# Patient Record
Sex: Male | Born: 1995 | Race: White | Hispanic: No | Marital: Single | Smoking: Current every day smoker
Health system: Southern US, Community
[De-identification: ages and names within clinical notes are randomized; demographics above are authoritative.]

## PROBLEM LIST (undated history)

## (undated) DIAGNOSIS — Z87442 Personal history of urinary calculi: Secondary | ICD-10-CM

## (undated) DIAGNOSIS — Z973 Presence of spectacles and contact lenses: Secondary | ICD-10-CM

## (undated) DIAGNOSIS — J45909 Unspecified asthma, uncomplicated: Secondary | ICD-10-CM

## (undated) HISTORY — PX: WISDOM TOOTH EXTRACTION: SHX21

## (undated) HISTORY — PX: MASTECTOMY: SHX3

---
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
## 2015-03-05 MED FILL — CLINDAMYCIN HCL 300 MG CAPS: 300 | 7 days supply | Qty: 21 | Fill #0

## 2015-03-05 MED FILL — HYDROCODON-APAP 10-325: 10-325 | 5 days supply | Qty: 30 | Fill #0

## 2015-03-05 MED FILL — DEXAMETHASONE 4 MG TABLET: 4 | 3 days supply | Qty: 9 | Fill #0

## 2015-04-08 MED FILL — MINOCYCLINE 50 MG CAPSULE: 50 | 30 days supply | Qty: 60 | Fill #2

## 2015-04-18 MED FILL — TESTOSTERONE CYP 200 MG/ML: 200 | 87 days supply | Qty: 5 | Fill #1

## 2015-07-17 MED FILL — ONDANSETRON ODT 8 MG TABLET: 8 | 5 days supply | Qty: 15 | Fill #0

## 2015-07-17 MED FILL — SULFAMETHOXAZOLE-TMP DS TAB: 800-160 | 7 days supply | Qty: 14 | Fill #0

## 2015-07-17 MED FILL — oxyCODONE HCL 5 MG TABS: 5 | 5 days supply | Qty: 40 | Fill #0

## 2015-07-17 MED FILL — NAPROXEN 500 MG TABLET: 500 | 30 days supply | Qty: 60 | Fill #0

## 2015-07-17 MED FILL — CYCLOBENZAPRINE 10 MG TAB: 10 | 14 days supply | Qty: 42 | Fill #0

## 2015-07-17 MED FILL — TRANSDERM-SCOP 1.5 MG/3 DAY: 1 | 1 days supply | Qty: 1 | Fill #0

## 2015-08-06 DIAGNOSIS — K719 Toxic liver disease, unspecified: Secondary | ICD-10-CM | POA: Diagnosis not present

## 2015-08-06 DIAGNOSIS — E281 Androgen excess: Secondary | ICD-10-CM | POA: Diagnosis not present

## 2015-08-06 DIAGNOSIS — D751 Secondary polycythemia: Secondary | ICD-10-CM | POA: Diagnosis not present

## 2015-08-06 MED FILL — TESTOSTERONE CYP 200 MG/ML: 200 | 35 days supply | Qty: 2 | Fill #0

## 2015-10-16 MED FILL — TESTOSTERONE CYP 200 MG/ML: 200 | 90 days supply | Qty: 5 | Fill #0

## 2016-01-13 MED FILL — TESTOSTERONE CYP 200 MG/ML: 200 | 90 days supply | Qty: 5 | Fill #1

## 2016-02-27 DIAGNOSIS — J029 Acute pharyngitis, unspecified: Secondary | ICD-10-CM | POA: Diagnosis not present

## 2016-02-27 MED FILL — AZITHROMYCIN 250 MG TABLET: 250 | 5 days supply | Qty: 6 | Fill #0

## 2016-04-07 DIAGNOSIS — F641 Gender identity disorder in adolescence and adulthood: Secondary | ICD-10-CM | POA: Diagnosis not present

## 2016-07-22 DIAGNOSIS — H5213 Myopia, bilateral: Secondary | ICD-10-CM | POA: Diagnosis not present

## 2016-07-22 DIAGNOSIS — H52223 Regular astigmatism, bilateral: Secondary | ICD-10-CM | POA: Diagnosis not present

## 2016-09-23 DIAGNOSIS — K719 Toxic liver disease, unspecified: Secondary | ICD-10-CM | POA: Diagnosis not present

## 2016-09-23 DIAGNOSIS — D751 Secondary polycythemia: Secondary | ICD-10-CM | POA: Diagnosis not present

## 2016-09-23 DIAGNOSIS — E291 Testicular hypofunction: Secondary | ICD-10-CM | POA: Diagnosis not present

## 2017-02-18 DIAGNOSIS — E281 Androgen excess: Secondary | ICD-10-CM | POA: Diagnosis not present

## 2017-02-18 DIAGNOSIS — D751 Secondary polycythemia: Secondary | ICD-10-CM | POA: Diagnosis not present

## 2017-07-21 ENCOUNTER — Encounter: Payer: Self-pay | Admitting: Family Medicine

## 2017-07-21 ENCOUNTER — Other Ambulatory Visit: Payer: Self-pay

## 2017-07-21 ENCOUNTER — Ambulatory Visit (INDEPENDENT_AMBULATORY_CARE_PROVIDER_SITE_OTHER): Payer: No Typology Code available for payment source | Admitting: Family Medicine

## 2017-07-21 VITALS — BP 100/68 | HR 106 | Temp 99.3°F | Ht 70.0 in | Wt 220.2 lb

## 2017-07-21 DIAGNOSIS — F640 Transsexualism: Secondary | ICD-10-CM | POA: Diagnosis not present

## 2017-07-21 DIAGNOSIS — Z789 Other specified health status: Secondary | ICD-10-CM

## 2017-07-21 MED ORDER — "NEEDLE (DISP) 25G X 1"" MISC": 3 refills | Status: AC

## 2017-07-21 MED ORDER — TESTOSTERONE CYPIONATE 200 MG/ML IM SOLN: INTRAMUSCULAR | 3 refills | Status: DC

## 2017-07-21 MED ORDER — SYRINGE (DISPOSABLE) 1 ML MISC: 3 refills | Status: AC

## 2017-07-21 MED ORDER — "NEEDLE (DISP) 18G X 1-1/2"" MISC": 3 refills | Status: AC

## 2017-07-21 MED FILL — TESTOSTERONE CYP 200 MG/ML: 200 | 87 days supply | Qty: 5 | Fill #0

## 2017-07-21 NOTE — Patient Instructions (Signed)
Come back for labs in a couple of weeks. I have called in refills. Let me know of ANY issues. Great to meet you!

## 2017-07-22 DIAGNOSIS — Z789 Other specified health status: Secondary | ICD-10-CM | POA: Insufficient documentation

## 2017-07-22 DIAGNOSIS — F640 Transsexualism: Secondary | ICD-10-CM | POA: Insufficient documentation

## 2017-07-22 NOTE — Progress Notes (Signed)
    CHIEF COMPLAINT / HPI:  new patoient for establish care. Traistioned Fto M > 2 years ah\go. Stable on testosterone.His provider retired. Has had mastectomy.  Has had some mild depressive symptoms intermittently throughout last few years. Saw a  Again. WOnders if I\I have any resources (specific to LGBTQ population)therapist for a while during the transition. Not having any significant issues right now but occasionally feells stressed and sad. 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  PERTINENT  PMH / PSH: I have reviewed the patient's medications, allergies, past medical and surgical history, smoking status and updated in the EMR as appropriate.   OBJECTIVE:  Vital signs reviewed. GENERAL: Well-developed, well-nourished, no acute distress. CARDIOVASCULAR: Regular rate and rhythm no murmur gallop or rub LUNGS: Clear to auscultation bilaterally, no rales or wheeze. ABDOMEN: Soft positive bowel sounds NEURO: No gross focal neurological deficits. MSK: Movement of extremity x 4.    ASSESSMENT / PLAN: Refills on testosterone. He is doing IM and we discussed possibly of SubQ but he thinks he is going to stay with current method Labs Information on EACP and other therao\psit options.

## 2017-08-03 ENCOUNTER — Other Ambulatory Visit: Payer: No Typology Code available for payment source

## 2017-09-28 ENCOUNTER — Telehealth: Payer: Self-pay | Admitting: Family Medicine

## 2017-09-28 DIAGNOSIS — Z789 Other specified health status: Secondary | ICD-10-CM

## 2017-09-28 DIAGNOSIS — F640 Transsexualism: Secondary | ICD-10-CM

## 2017-09-28 NOTE — Telephone Encounter (Signed)
Dear Ernest Haynes Team Orders are placed. He can call for lab appointment Please let him know THANKS! Denny LevySara Yailen Zemaitis

## 2017-09-28 NOTE — Telephone Encounter (Signed)
Contacted pt and scheduled lab appointment for 09/29/2017. Lamonte SakaiZimmerman Rumple, April D, [place]

## 2017-09-28 NOTE — Telephone Encounter (Signed)
Dr Jennette Kettleneal had requested labs in June 2019. Pt was not able to keep the appt. He would like to to the labs now. Please request the labs and contact him to schedule them

## 2017-09-29 ENCOUNTER — Other Ambulatory Visit: Payer: No Typology Code available for payment source

## 2017-09-29 DIAGNOSIS — F640 Transsexualism: Secondary | ICD-10-CM

## 2017-09-29 DIAGNOSIS — Z789 Other specified health status: Secondary | ICD-10-CM

## 2017-09-29 MED FILL — DICLOFENAC SOD EC 75 MG TAB: 75 | 15 days supply | Qty: 30 | Fill #0

## 2017-09-29 MED FILL — MELOXICAM 15 MG TABLET: 15 | 30 days supply | Qty: 30 | Fill #0

## 2017-09-30 ENCOUNTER — Encounter: Payer: Self-pay | Admitting: Physical Therapy

## 2017-09-30 ENCOUNTER — Ambulatory Visit: Payer: No Typology Code available for payment source | Attending: Family Medicine | Admitting: Physical Therapy

## 2017-09-30 ENCOUNTER — Other Ambulatory Visit: Payer: Self-pay

## 2017-09-30 DIAGNOSIS — G8929 Other chronic pain: Secondary | ICD-10-CM | POA: Diagnosis present

## 2017-09-30 DIAGNOSIS — R2689 Other abnormalities of gait and mobility: Secondary | ICD-10-CM | POA: Insufficient documentation

## 2017-09-30 DIAGNOSIS — M545 Low back pain, unspecified: Secondary | ICD-10-CM

## 2017-09-30 DIAGNOSIS — M62830 Muscle spasm of back: Secondary | ICD-10-CM | POA: Insufficient documentation

## 2017-09-30 LAB — LIPID PANEL
CHOL/HDL RATIO: 6.1 ratio — AB (ref 0.0–4.4)
Cholesterol, Total: 195 mg/dL (ref 100–199)
HDL: 32 mg/dL — ABNORMAL LOW (ref >39)
LDL Calculated: 129 mg/dL — ABNORMAL HIGH (ref 0–99)
Triglycerides: 168 mg/dL — ABNORMAL HIGH (ref 0–149)
VLDL Cholesterol Cal: 34 mg/dL (ref 5–40)

## 2017-09-30 LAB — CBC
HEMATOCRIT: 52.7 % — AB (ref 34.0–46.6)
HEMOGLOBIN: 18.2 g/dL — AB (ref 11.1–15.9)
MCH: 31.7 pg (ref 26.6–33.0)
MCHC: 34.5 g/dL (ref 31.5–35.7)
MCV: 92 fL (ref 79–97)
PLATELETS: 323 10*3/uL (ref 150–450)
RBC: 5.74 x10E6/uL — ABNORMAL HIGH (ref 3.77–5.28)
RDW: 11.8 % — AB (ref 12.3–15.4)
WBC: 8.4 10*3/uL (ref 3.4–10.8)

## 2017-09-30 LAB — BASIC METABOLIC PANEL
BUN/Creatinine Ratio: 14 (ref 9–23)
BUN: 14 mg/dL (ref 6–20)
CHLORIDE: 101 mmol/L (ref 96–106)
CO2: 23 mmol/L (ref 20–29)
Calcium: 9.7 mg/dL (ref 8.7–10.2)
Creatinine, Ser: 0.98 mg/dL (ref 0.57–1.00)
GFR calc Af Amer: 95 mL/min/{1.73_m2} (ref >59)
GFR, EST NON AFRICAN AMERICAN: 83 mL/min/{1.73_m2} (ref >59)
GLUCOSE: 88 mg/dL (ref 65–99)
POTASSIUM: 4.4 mmol/L (ref 3.5–5.2)
SODIUM: 140 mmol/L (ref 134–144)

## 2017-09-30 LAB — TESTOSTERONE: Testosterone: 389 ng/dL — ABNORMAL HIGH (ref 8–48)

## 2017-10-01 NOTE — Therapy (Signed)
[facility] [address] [place], [place], [address] Phone: [phone]   Fax:  [phone]  Physical Therapy Evaluation  Patient Details  Name: Ernest Haynes MRN: [id number] Date of Birth: 1995-12-02 Referring Provider: Dr Deatra James    Encounter Date: 09/30/2017  PT End of Session - 09/30/17 1350    Visit Number  1    Number of Visits  12    Date for PT Re-Evaluation  11/11/17    Authorization Type  [facility] focus plan     PT Start Time  1335    PT Stop Time  1415    PT Time Calculation (min)  40 min    Activity Tolerance  Patient tolerated treatment well    Behavior During Therapy  [facility] for tasks assessed/performed       History reviewed. No pertinent past medical history.  History reviewed. No pertinent surgical history.  There were no vitals filed for this visit.   Subjective Assessment - 09/30/17 1340    Subjective  Patient began having low back pain about 2 weeks ago. He does not remmeber doing anything but he does work at [employer] and does a lot of lifting. His pain is in the middle of his back and radiates into his legs. It can be in either leg. The patient has had back pain in the past but it was not as intense and did not last as long.     Limitations  Walking;Lifting    How long can you sit comfortably?  depends on how it is feeling     How long can you stand comfortably?  depends on how it is feeling     How long can you walk comfortably?  short distances increase pain     Diagnostic tests  nothing taken     Patient Stated Goals  less pain; be able to stretch it out     Currently in Pain?  Yes    Pain Score  6  At worst     Pain Location  Back    Pain Orientation  Mid    Pain Descriptors / Indicators  Aching    Pain Type  Chronic pain    Pain Onset  More than a month ago    Pain Frequency  Constant    Aggravating Factors   lifting, walking, worse in the morning     Pain Relieving Factors  trying to  find the right position    Effect of Pain on Daily Activities  diffiulty perfroming work tasks          [facility] PT Assessment - 10/01/17 0001      Assessment   Medical Diagnosis  Low Back Pain     Referring Provider  Dr Deatra James     Onset Date/Surgical Date  -- 2 weeks prior     Hand Dominance  Right    Next MD Visit  nothjing Scheduled     Prior Therapy  None       Precautions   Precautions  None      Restrictions   Weight Bearing Restrictions  No      Balance Screen   Has the patient fallen in the past 6 months  No    Has the patient had a decrease in activity level because of a fear of falling?   No    Is the patient reluctant to leave their home because of a fear of falling?  No      Home Environment   Additional Comments  Stairs going into his house but dont hurt his back.       Prior Function   Level of Independence  Independent    Vocation  Full time employment    Vocation Requirements  Works at [employer] book [employer]      Cognition   Overall Cognitive Status  Within Capital OneFunctional Limits for tasks assessed    Attention  Focused    Focused Attention  Appears intact    Memory  Appears intact    Awareness  Appears intact    Problem Solving  Appears intact      Observation/Other Assessments   Focus on Therapeutic Outcomes (FOTO)   54% limitation       Sensation   Additional Comments  Pain radiating into both legs at times; it can change which side       Coordination   Gross Motor Movements are Fluid and Coordinated  Yes    Fine Motor Movements are Fluid and Coordinated  Yes      Posture/Postural Control   Posture/Postural Control  No significant limitations      AROM   Overall AROM Comments  Pain with bilateral side glide     Lumbar Flexion  34    Lumbar Extension  15    Lumbar - Right Side Bend  pain     Lumbar - Left Side Bend  pain equal to the left     Lumbar - Right Rotation  limited 25 % with pain     Lumbar - Left Rotation  limited 25% with equal pain        PROM   Overall PROM Comments  pain with end range hip flexion bilateral       Strength   Overall Strength Comments  5/5 gross bilateral lower extremity strength       Flexibility   Soft Tissue Assessment /Muscle Length  yes    Hamstrings  limited 40 degrees 90/90 bilateral       Palpation   SI assessment   pain with SI compression       Special Tests   Other special tests  (-) SLR bilateral       Ambulation/Gait   Gait Comments  walks with flexed trunk with decreased bilateral lower extremity flexion and decreased hip rotation,.                 Objective measurements completed on examination: See above findings.      [facility] Adult PT Treatment/Exercise - 10/01/17 0001      Lumbar Exercises: Stretches   Active Hamstring Stretch Limitations  reviewed 3 position hamstring stretch     Piriformis Stretch Limitations  2x20 sec hold       Lumbar Exercises: Standing   Other Standing Lumbar Exercises  reviewed proper lifting technque     Other Standing Lumbar Exercises  tennis ball trigger point release       Lumbar Exercises: Supine   Pelvic Tilt Limitations  x10 with abdominal breathing              PT Education - 09/30/17 1350    Education Details  HEP; symptom mangement     Person(s) Educated  Patient    Methods  Explanation;Demonstration;Tactile cues;Verbal cues    Comprehension  Verbalized understanding;Returned demonstration;Verbal cues required;Tactile cues required;Need further instruction       PT Short Term Goals - 10/01/17 [id number]  PT SHORT TERM GOAL #1   Title  Patient will be independent with basic HEP     Time  4    Period  Weeks    Target Date  10/29/17      PT SHORT TERM GOAL #2   Title  Patient will report no radiating pain in to his legs with pain no > 2/10     Time  4    Period  Weeks    Status  New    Target Date  10/29/17      PT SHORT TERM GOAL #3   Title  Patient will increase pain free lumbar flexion by 30 degrees     Time  4    Period  Weeks    Status  New    Target Date  10/29/17        PT Long Term Goals - 10/01/17 0821      PT LONG TERM GOAL #1   Title  Patient will perfrom wark tasks for 4 hours without increased pain     Time  8    Period  Weeks    Status  New    Target Date  11/26/17      PT LONG TERM GOAL #2   Title  Patient will demostrate a 24% limitation on FOTO     Time  9    Period  Weeks    Status  New    Target Date  11/26/17             Plan - 09/30/17 1351    Clinical Impression Statement  Patient is a 22 year old male who\ with lower back pain that at times radiates into both legs. He has pain with forward flexion but also pain in prone. Is is questionable if it is a disc buldge and with his age stenosis is questioanble as well. He may just have an acute strain of his back. he has aspasming of his bilateral parapsinals and into his glutes. He was given stretches and a tennis ball for soft tissue mobilization. He also has pain with palpation and decreased spring in his SI. He would benefit from skilled therapy to decrease acute spasming and decrease pain. Therapy also reviewed proper lifting technique with the patient.     History and Personal Factors relevant to plan of care:  none     Clinical Presentation  Evolving    Clinical Decision Making  Moderate    Rehab Potential  Good    PT Frequency  2x / week    PT Duration  6 weeks    PT Treatment/Interventions  Cryotherapy;Electrical Stimulation;ADLs/Self Care Home Management;Moist Heat;Ultrasound;DME Instruction;Stair training;Gait training;Functional mobility training;Therapeutic activities;Therapeutic exercise;Neuromuscular re-education;Patient/family education;Manual techniques;Passive range of motion;Dry needling;Taping    PT Next Visit Plan  consdier SI mobilizations; trigger point release to bilateral parapsinals and bilarteral glutes; begin core strengthening; consider thomas stretch and prayer stretch; add  clamshell with ab breathing; add  bridging if tolerated. Prone positioning hurts him.  review lifting technique at some point when his pain calms down. cosndier supine march with abdominal breathing     PT Home Exercise Plan  PPT with abdominal breathing; pirifromis stretch; hamstring stretch; lower trunk rotation for the morning     Consulted and Agree with Plan of Care  Patient       Patient will benefit from skilled therapeutic intervention in order to improve the following deficits and impairments:  Abnormal gait, Pain, Decreased activity tolerance,  Decreased endurance, Decreased range of motion, Decreased strength, Difficulty walking  Visit Diagnosis: Chronic bilateral low back pain without sciatica  Muscle spasm of back  Other abnormalities of gait and mobility     Problem List Patient Active Problem List   Diagnosis Date Noted  . 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 Gender dysphoria in adult 07/22/2017    Dessie Coma  PT DPT  10/01/2017, 8:27 AM  [facility] [address] [place], [place], [address] Phone: [phone]   Fax:  [phone]  Name: Ernest Haynes MRN: [id number] Date of Birth: December 02, 1995

## 2017-10-05 ENCOUNTER — Encounter: Payer: Self-pay | Admitting: Physical Therapy

## 2017-10-05 ENCOUNTER — Ambulatory Visit: Payer: No Typology Code available for payment source | Admitting: Physical Therapy

## 2017-10-05 DIAGNOSIS — M62830 Muscle spasm of back: Secondary | ICD-10-CM

## 2017-10-05 DIAGNOSIS — M545 Low back pain: Secondary | ICD-10-CM | POA: Diagnosis not present

## 2017-10-05 DIAGNOSIS — G8929 Other chronic pain: Secondary | ICD-10-CM

## 2017-10-05 DIAGNOSIS — R2689 Other abnormalities of gait and mobility: Secondary | ICD-10-CM

## 2017-10-05 NOTE — Patient Instructions (Signed)

## 2017-10-05 NOTE — Therapy (Addendum)
[place], [place], [address] Phone: [phone]   Fax:  [phone]  Physical Therapy Treatment/ Discharge   Patient Details  Name: Ernest Haynes MRN: [id number] Date of Birth: [birth year] Referring Provider: Dr Donald Prose    Encounter Date: 10/05/2017  PT End of Session - 10/05/17 1250    Visit Number  2    Number of Visits  12    Date for PT Re-Evaluation  11/11/17    PT Start Time  1150    PT Stop Time  1231    PT Time Calculation (min)  41 min    Activity Tolerance  Patient tolerated treatment well    Behavior During Therapy  [facility] for tasks assessed/performed       History reviewed. No pertinent past medical history.  History reviewed. No pertinent surgical history.  There were no vitals filed for this visit.  Subjective Assessment - 10/05/17 1158    Subjective  has been doing the exercises.  pain 80% improvement.    Pain Location  Hip    Pain Orientation  Right;Left superior    Pain Descriptors / Indicators  Aching    Pain Type  Chronic pain    Pain Frequency  Intermittent    Aggravating Factors   walking longer distances  ,, resting between walking.     Pain Relieving Factors  exercise,,  rest    Effect of Pain on Daily Activities  not lifting, off work for now.                        [facility] Adult PT Treatment/Exercise - 10/05/17 0001      Self-Care   Self-Care  ADL's;Posture    ADL's  ADL handout briefly reviewed.     Posture  lumbar roll helped decrease pressure,  always uses lumbar support in car      Lumbar Exercises: Stretches   Active Hamstring Stretch Limitations  3 reps X 3 each    Piriformis Stretch Limitations  2  X 20 second holds,  feels good.     Gastroc Stretch  3 reps;30 seconds    Gastroc Stretch Limitations  at wall,  both  left tighter than right      Lumbar Exercises: Supine   Pelvic Tilt  10 reps    Pelvic Tilt Limitations  x10 with abdominal  breathing     Heel Slides  10 reps    Bent Knee Raise  10 reps No pain             PT Education - 10/05/17 1247    Education Details  Posture ADL    Person(s) Educated  Patient    Methods  Explanation;Demonstration;Verbal cues;Handout    Comprehension  Verbalized understanding;Returned demonstration       PT Short Term Goals - 10/05/17 1207      PT SHORT TERM GOAL #1   Title  Patient will be independent with basic HEP     Baseline  independent so far.    Time  4    Period  Weeks    Status  On-going      PT SHORT TERM GOAL #2   Title  Patient will report no radiating pain in to his legs with pain no > 2/10     Baseline  no leg pain for about 2 days,  pain at most up to 4/10    Time  4  Period  Weeks    Status  Partially Met      PT SHORT TERM GOAL #3   Time  4    Period  Weeks    Status  Unable to assess        PT Long Term Goals - 10/01/17 0821      PT LONG TERM GOAL #1   Title  Patient will perfrom wark tasks for 4 hours without increased pain     Time  8    Period  Weeks    Status  New    Target Date  11/26/17      PT LONG TERM GOAL #2   Title  Patient will demostrate a 24% limitation on FOTO     Time  9    Period  Weeks    Status  New    Target Date  11/26/17            Plan - 10/05/17 1251    Clinical Impression Statement  No pain at end of session.  Pain improved 80%.  Patient  is compliant with HEP. STG#2 partially met.    PT Next Visit Plan  Stabilization if pain continues to improve.  progress HEp s able.  Calf stretch.     PT Home Exercise Plan  PPT with abdominal breathing; pirifromis stretch; hamstring stretch; lower trunk rotation for the morning     Consulted and Agree with Plan of Care  Patient       Patient will benefit from skilled therapeutic intervention in order to improve the following deficits and impairments:     Visit Diagnosis: Chronic bilateral low back pain without sciatica  Muscle spasm of back  Other  abnormalities of gait and mobility  .PHYSICAL THERAPY DISCHARGE SUMMARY  Visits from Start of Care: 2   Current functional level related to goals / functional outcomes: Significant improvement in pain   Remaining deficits: Unknown ptient did not return for follow up    Education / Equipment: HEP   Plan: Patient agrees to discharge.  Patient goals were not met. Patient is being discharged due to meeting the stated rehab goals.  ?????       Problem List Patient Active Problem List   Diagnosis Date Noted  . 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 Gender dysphoria in adult 07/22/2017   Ernest Haynes PT DPT  12/21/2017   Siriyah Ambrosius PTA 10/05/2017, 12:54 PM  [facility] [address] [place], [place], [address] Phone: [phone]   Fax:  [phone]  Name: Ernest Haynes MRN: [id number] Date of Birth: 07-07-1995

## 2017-10-13 ENCOUNTER — Ambulatory Visit: Payer: No Typology Code available for payment source | Admitting: Physical Therapy

## 2017-10-19 MED FILL — TESTOSTERONE CYP 200 MG/ML: 200 | 17 days supply | Qty: 1 | Fill #1

## 2017-10-20 ENCOUNTER — Telehealth: Payer: Self-pay

## 2017-10-20 NOTE — Telephone Encounter (Signed)
[facility], [place] needed on Testosterone.  Clinical questions submitted via Cover My Meds.  Waiting on response, could take up to 72 hours.  Cover My Meds info: Key: [id number]  Ples SpecterAlisa Brake, RN [facility] RN)

## 2017-10-22 NOTE — Telephone Encounter (Signed)
Testosterone approved 10/19/17 - 10/19/18 per [employer]. [facility] notified. Ples SpecterAlisa Demarco Bacci, RN [facility] RN)

## 2017-10-26 ENCOUNTER — Encounter: Payer: Self-pay | Admitting: Family Medicine

## 2017-10-26 NOTE — Progress Notes (Signed)
Recheck cbc at next viit as hematocrit is a little  elevated at this dose of testosterone L.s.

## 2017-11-04 MED FILL — TESTOSTERONE CYP 200 MG/ML: 200 | 84 days supply | Qty: 5 | Fill #2

## 2018-01-31 ENCOUNTER — Other Ambulatory Visit: Payer: Self-pay | Admitting: Family Medicine

## 2018-02-03 ENCOUNTER — Other Ambulatory Visit: Payer: Self-pay | Admitting: Family Medicine

## 2018-02-07 MED FILL — TESTOSTERONE CYP 200 MG/ML: 200 | 84 days supply | Qty: 5 | Fill #0

## 2018-05-16 MED FILL — TESTOSTERONE CYPIONATE 200: 200 | 84 days supply | Qty: 5 | Fill #1

## 2018-06-12 ENCOUNTER — Other Ambulatory Visit: Payer: Self-pay

## 2018-06-12 ENCOUNTER — Telehealth: Payer: No Typology Code available for payment source | Admitting: Family

## 2018-06-12 ENCOUNTER — Emergency Department (HOSPITAL_BASED_OUTPATIENT_CLINIC_OR_DEPARTMENT_OTHER)
Admission: EM | Admit: 2018-06-12 | Discharge: 2018-06-12 | Disposition: A | Discharge status: Home / Self Care | Payer: No Typology Code available for payment source | Attending: Emergency Medicine | Admitting: Emergency Medicine

## 2018-06-12 ENCOUNTER — Encounter (HOSPITAL_BASED_OUTPATIENT_CLINIC_OR_DEPARTMENT_OTHER): Payer: Self-pay | Admitting: Emergency Medicine

## 2018-06-12 ENCOUNTER — Emergency Department (HOSPITAL_BASED_OUTPATIENT_CLINIC_OR_DEPARTMENT_OTHER): Payer: No Typology Code available for payment source

## 2018-06-12 DIAGNOSIS — F640 Transsexualism: Secondary | ICD-10-CM | POA: Insufficient documentation

## 2018-06-12 DIAGNOSIS — R1032 Left lower quadrant pain: Secondary | ICD-10-CM | POA: Diagnosis present

## 2018-06-12 DIAGNOSIS — N200 Calculus of kidney: Secondary | ICD-10-CM | POA: Insufficient documentation

## 2018-06-12 DIAGNOSIS — N201 Calculus of ureter: Secondary | ICD-10-CM | POA: Diagnosis not present

## 2018-06-12 DIAGNOSIS — F17290 Nicotine dependence, other tobacco product, uncomplicated: Secondary | ICD-10-CM | POA: Insufficient documentation

## 2018-06-12 DIAGNOSIS — R1030 Lower abdominal pain, unspecified: Secondary | ICD-10-CM

## 2018-06-12 DIAGNOSIS — R109 Unspecified abdominal pain: Secondary | ICD-10-CM

## 2018-06-12 DIAGNOSIS — Z79899 Other long term (current) drug therapy: Secondary | ICD-10-CM | POA: Insufficient documentation

## 2018-06-12 DIAGNOSIS — R531 Weakness: Secondary | ICD-10-CM

## 2018-06-12 LAB — URINALYSIS, ROUTINE W REFLEX MICROSCOPIC
Bilirubin Urine: NEGATIVE
Glucose, UA: NEGATIVE mg/dL
Ketones, ur: NEGATIVE mg/dL
Nitrite: NEGATIVE
Protein, ur: NEGATIVE mg/dL
Specific Gravity, Urine: <1.005 — ABNORMAL LOW (ref 1.005–1.030)
pH: 6 (ref 5.0–8.0)

## 2018-06-12 LAB — CBC WITH DIFFERENTIAL/PLATELET
Abs Immature Granulocytes: 0.02 10*3/uL (ref 0.00–0.07)
Basophils Absolute: 0.1 10*3/uL (ref 0.0–0.1)
Basophils Relative: 1 %
Eosinophils Absolute: 0.1 10*3/uL (ref 0.0–0.5)
Eosinophils Relative: 1 %
HCT: 49.2 % (ref 39.0–52.0)
Hemoglobin: 17.3 g/dL — ABNORMAL HIGH (ref 13.0–17.0)
Immature Granulocytes: 0 %
Lymphocytes Relative: 23 %
Lymphs Abs: 2.5 10*3/uL (ref 0.7–4.0)
MCH: 31.7 pg (ref 26.0–34.0)
MCHC: 35.2 g/dL (ref 30.0–36.0)
MCV: 90.1 fL (ref 80.0–100.0)
Monocytes Absolute: 0.9 10*3/uL (ref 0.1–1.0)
Monocytes Relative: 8 %
Neutro Abs: 7.6 10*3/uL (ref 1.7–7.7)
Neutrophils Relative %: 67 %
Platelets: 234 10*3/uL (ref 150–400)
RBC: 5.46 MIL/uL (ref 4.22–5.81)
RDW: 11.3 % — ABNORMAL LOW (ref 11.5–15.5)
WBC: 11.1 10*3/uL — ABNORMAL HIGH (ref 4.0–10.5)
nRBC: 0 % (ref 0.0–0.2)

## 2018-06-12 LAB — COMPREHENSIVE METABOLIC PANEL
ALT: 20 U/L (ref 0–44)
AST: 20 U/L (ref 15–41)
Albumin: 4.6 g/dL (ref 3.5–5.0)
Alkaline Phosphatase: 102 U/L (ref 38–126)
Anion gap: 7 (ref 5–15)
BUN: 13 mg/dL (ref 6–20)
CO2: 24 mmol/L (ref 22–32)
Calcium: 9.3 mg/dL (ref 8.9–10.3)
Chloride: 106 mmol/L (ref 98–111)
Creatinine, Ser: 1.04 mg/dL (ref 0.61–1.24)
GFR calc Af Amer: >60 mL/min (ref >60)
GFR calc non Af Amer: >60 mL/min (ref >60)
Glucose, Bld: 91 mg/dL (ref 70–99)
Potassium: 3.5 mmol/L (ref 3.5–5.1)
Sodium: 137 mmol/L (ref 135–145)
Total Bilirubin: 0.9 mg/dL (ref 0.3–1.2)
Total Protein: 7.1 g/dL (ref 6.5–8.1)

## 2018-06-12 LAB — URINALYSIS, MICROSCOPIC (REFLEX)

## 2018-06-12 LAB — LIPASE, BLOOD: Lipase: 23 U/L (ref 11–51)

## 2018-06-12 MED ORDER — HYDROCODONE-ACETAMINOPHEN 5-325 MG PO TABS: 1.0000 | ORAL_TABLET | ORAL | 0 refills | Status: AC | PRN

## 2018-06-12 MED ORDER — SODIUM CHLORIDE 0.9 % IV BOLUS
1000.0000 mL | Freq: Once | INTRAVENOUS | Status: AC
  Administered 2018-06-12: 1000 mL via INTRAVENOUS

## 2018-06-12 MED ORDER — ONDANSETRON HCL 4 MG PO TABS: 4.0000 mg | ORAL_TABLET | Freq: Three times a day (TID) | ORAL | 0 refills | Status: DC | PRN

## 2018-06-12 NOTE — ED Triage Notes (Addendum)
LLQ abd pain radiating into his back with nausea since 2am. Also reports pain to urethra yesterday. Also reports generalized weakness yesterday which cause him to fall. Denies injury from fall. Noted to be tachy in 150's. Denies chest pain or SOB.

## 2018-06-12 NOTE — Progress Notes (Signed)
Based on what you shared with me, I feel your condition warrants further evaluation and I recommend that you be seen for a face to face office visit.  Based on your symptoms you need to be evaluated face to face.    NOTE: If you entered your credit card information for this eVisit, you will not be charged. You may see a "hold" on your card for the $35 but that hold will drop off and you will not have a charge processed.  If you are having a true medical emergency please call 911.  If you need an urgent face to face visit, [place] has four urgent care centers for your convenience.    PLEASE NOTE: THE INSTACARE LOCATIONS AND URGENT CARE CLINICS DO NOT HAVE THE TESTING FOR CORONAVIRUS COVID19 AVAILABLE.  IF YOU FEEL YOU NEED THIS TEST YOU MUST GO TO A TRIAGE LOCATION AT ONE OF THE HOSPITAL EMERGENCY DEPARTMENTS   [email or web address] to reserve your spot online an avoid wait times  [facility] [address], [address] [place], [place] [address] Modified hours of operation: Monday-Friday, 10 AM to 6 PM  Saturday & Sunday 10 AM to 4 PM *Across the street from [employer]  [employer] [employer] (New Address!) [address], [address] [place], [place] [address] *Just off [address] [address], across the road from [place]* Modified hours of operation: Monday-Friday, 10 AM to 5 PM  Closed Saturday & Sunday   The following sites will take your insurance:  . [facility]  [phone] Get Driving Directions Find a Provider at this Location  [address] [place], [place] [address] . 10 am to 8 pm Monday-Friday . 12 pm to 8 pm Saturday-Sunday   . [facility] Care at [facility]  [phone] Get Driving Directions Find a Provider at this Location  [address], [address] [place], [place] [address] . 8 am to 8 pm Monday-Friday . 9 am to 6 pm Saturday . 11 am to 6 pm Sunday   . [facility] at  [facility]  [phone] Get Driving Directions  [address] [place], [place] [address] . 8 am to 8 pm Monday-Friday . 8 am to 4 pm Saturday-Sunday   Your e-visit answers were reviewed by a board certified advanced clinical practitioner to complete your personal care plan.  Thank you for using e-Visits.

## 2018-06-12 NOTE — Discharge Instructions (Addendum)
Your history, exam, and imaging today was consistent with kidney stone.  We have a low suspicion for infected stone at this time.  A urine culture was sent.  Please use the pain is in, nausea medicine, and maintain hydration.  Please follow-up with your PCP.  If any symptoms change or worsen, is return to the nearest emergency department.

## 2018-06-12 NOTE — ED Notes (Signed)
Patient transported to CT 

## 2018-06-12 NOTE — ED Provider Notes (Signed)
[facility] EMERGENCY DEPARTMENT Provider Note   CSN: [id number] Arrival date & time: 06/12/18  1254    History   Chief Complaint Chief Complaint  Patient presents with  . Abdominal Pain    HPI Kriz Canedy is a 23 y.o. male.     The history is provided by the patient and medical records. No language interpreter was used.  Flank Pain  This is a new problem. The current episode started 12 to 24 hours ago. The problem occurs constantly. The problem has been gradually improving. Associated symptoms include abdominal pain (left flank and side). Pertinent negatives include no chest pain, no headaches and no shortness of breath. Nothing aggravates the symptoms. Nothing relieves the symptoms. He has tried nothing for the symptoms. The treatment provided no relief.    History reviewed. No pertinent past medical history.  Patient Active Problem List   Diagnosis Date Noted  . 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 Gender dysphoria in adult 07/22/2017    History reviewed. No pertinent surgical history.   OB History   No obstetric history on file.      Home Medications    Prior to Admission medications   Medication Sig Start Date End Date Taking? Authorizing Provider  naproxen (NAPROSYN) 500 MG tablet Take 500 mg by mouth 2 (two) times daily with a meal.    [provider]  NEEDLE, DISP, 18 G (B-D BLUNT FILL NEEDLE) 18G X 1-1/2" MISC Use as directed for testosterone injectioins 07/21/17   Nestor Ramp, MD  NEEDLE, DISP, 25 G (B-D HYPODERMIC NEEDLE 25GX1") 25G X 1" MISC Use for testosterone injection as directed 07/21/17   Nestor Ramp, MD  Syringe, Disposable, (B-D SYRINGE LUER-LOK 1CC) 1 ML MISC Use as directed for testosterone injection 07/21/17   Nestor Ramp, MD  testosterone cypionate (DEPOTESTOSTERONE CYPIONATE) 200 MG/ML injection INJECT 0.4 MLS INTO THE MUSCLE ONCE A WEEK 02/07/18   Nestor Ramp, MD    Family History No family history on file.  Social History  Social History   Tobacco Use  . Smoking status: Current Every Day Smoker    Types: E-cigarettes  . Smokeless tobacco: Never Used  Substance Use Topics  . Alcohol use: No  . Drug use: No     Allergies   Patient has no allergy information on record.   Review of Systems Review of Systems  Constitutional: Negative for chills, diaphoresis, fatigue and fever.  HENT: Negative for congestion.   Eyes: Negative for visual disturbance.  Respiratory: Negative for cough, chest tightness, shortness of breath, wheezing and stridor.   Cardiovascular: Negative for chest pain, palpitations and leg swelling.  Gastrointestinal: Positive for abdominal pain (left flank and side) and nausea. Negative for constipation and diarrhea.  Genitourinary: Positive for flank pain. Negative for dysuria and frequency.  Musculoskeletal: Positive for back pain. Negative for neck pain and neck stiffness.  Neurological: Negative for weakness, light-headedness, numbness and headaches.  Psychiatric/Behavioral: Negative for agitation.  All other systems reviewed and are negative.    Physical Exam Updated Vital Signs BP 108/66   Pulse 79   Temp 99.2 F (37.3 C) (Oral)   Resp 14   Ht 5\' 10"  (1.778 m)   Wt 97.5 kg   SpO2 100%   BMI 30.85 kg/m   Physical Exam Vitals signs and nursing note reviewed.  Constitutional:      General: He is not in acute distress.    Appearance: He is well-developed. He is not ill-appearing,  toxic-appearing or diaphoretic.  HENT:     Head: Normocephalic and atraumatic.  Eyes:     General: No scleral icterus.    Conjunctiva/sclera: Conjunctivae normal.  Neck:     Musculoskeletal: Neck supple.  Cardiovascular:     Rate and Rhythm: Regular rhythm. Tachycardia present.     Heart sounds: Normal heart sounds. No murmur.  Pulmonary:     Effort: Pulmonary effort is normal. No respiratory distress.     Breath sounds: Normal breath sounds. No wheezing, rhonchi or rales.  Chest:      Chest wall: No tenderness.  Abdominal:     General: Abdomen is flat. There is no distension.     Palpations: Abdomen is soft.     Tenderness: There is no abdominal tenderness. There is left CVA tenderness. There is no right CVA tenderness.  Musculoskeletal:     Thoracic back: He exhibits tenderness and pain.       Back:  Skin:    General: Skin is warm and dry.     Capillary Refill: Capillary refill takes less than 2 seconds.  Neurological:     Mental Status: He is alert.  Psychiatric:        Mood and Affect: Mood is anxious.      ED Treatments / Results  Labs (all labs ordered are listed, but only abnormal results are displayed) Labs Reviewed  URINALYSIS, ROUTINE W REFLEX MICROSCOPIC - Abnormal; Notable for the following components:      Result Value   Specific Gravity, Urine <1.005 (*)    Hgb urine dipstick SMALL (*)    Leukocytes,Ua SMALL (*)    All other components within normal limits  CBC WITH DIFFERENTIAL/PLATELET - Abnormal; Notable for the following components:   WBC 11.1 (*)    Hemoglobin 17.3 (*)    RDW 11.3 (*)    All other components within normal limits  URINALYSIS, MICROSCOPIC (REFLEX) - Abnormal; Notable for the following components:   Bacteria, UA FEW (*)    All other components within normal limits  URINE CULTURE  COMPREHENSIVE METABOLIC PANEL  LIPASE, BLOOD    EKG EKG Interpretation  Date/Time:  Sunday June 12 2018 13:05:31 EDT Ventricular Rate:  126 PR Interval:    QRS Duration: 120 QT Interval:  340 QTC Calculation: 493 R Axis:   44 Text Interpretation:  Sinus tachycardia Atrial premature complex Nonspecific intraventricular conduction delay no prior ECG for comparison.  No STEMI Confirmed by Theda Belfastegeler, Chris ([id number]) on 06/12/2018 1:19:19 PM   Radiology Ct Renal Stone Study  Result Date: 06/12/2018 CLINICAL DATA:  Acute onset LEFT flank pain and LEFT groin pain. EXAM: CT ABDOMEN AND PELVIS WITHOUT CONTRAST TECHNIQUE: Multidetector CT  imaging of the abdomen and pelvis was performed following the standard protocol without IV contrast. COMPARISON:  None. FINDINGS: Lower chest: No acute abnormality. Hepatobiliary: No focal liver abnormality is seen. No gallstones, gallbladder wall thickening, or biliary dilatation. Pancreas: Unremarkable. No pancreatic ductal dilatation or surrounding inflammatory changes. Spleen: Normal in size without focal abnormality. Adrenals/Urinary Tract: Adrenal glands appear normal. 5 mm RIGHT renal stone. 1 mm stone at the LEFT UVJ causing minimal LEFT-sided hydronephrosis. Bladder is unremarkable, partially decompressed. Stomach/Bowel: No dilated large or small bowel loops. No evidence of bowel wall inflammation. Appendix is not convincingly seen but there are no inflammatory changes about the cecum to suggest acute appendicitis. Stomach is unremarkable, partially decompressed. Vascular/Lymphatic: No significant vascular findings are present. No enlarged abdominal or pelvic lymph nodes. Reproductive: Uterus and  bilateral adnexa are unremarkable. Other: No free fluid or abscess collection. No free intraperitoneal air. Musculoskeletal: No acute or suspicious osseous finding. IMPRESSION: 1. 1 mm stone at the left UVJ causing minimal left-sided hydronephrosis. 2. 5 mm nonobstructing right renal stone. Electronically Signed   By: Bary Richard M.D.   On: 06/12/2018 14:12    Procedures Procedures (including critical care time)  Medications Ordered in ED Medications  sodium chloride 0.9 % bolus 1,000 mL (0 mLs Intravenous Stopped 06/12/18 1428)     Initial Impression / Assessment and Plan / ED Course  I have reviewed the triage vital signs and the nursing notes.  Pertinent labs & imaging results that were available during my care of the patient were reviewed by me and considered in my medical decision making (see chart for details).        Rune Mendez is a 23 y.o. male who is a male to male  transgender patient and has PCOS who presents with left back, left flank, and left sided abdominal pain.  Patient reports that his symptoms began overnight.  He reports it started suddenly.  The pain radiates towards his left CVA area and left back.  He has a strong family history of kidney stones and thinks this is what is going on.  He has no personal history of it.  He denies any urinary changes but does report he thought his urethra was somewhat painful.  He has history of PCOS but has never had this type of pain related.  Has no history of torsion.  He has not had a menstrual cycle in several years due to hormone therapy.  He reports no vaginal discharge or vaginal bleeding.  He denies any trauma leading up to the pain however he does report that after the pain began he actually tripped and fell hitting his left side on the ground.  He reports the pain has been moderate to severe and is currently moderate.  He took medications given to him by his father and he reports the pain is improved.  He denies any fevers, chills, chest pain, shortness of breath.  He does report some nausea but has resolved.  On exam, lungs were clear and chest was nontender.  Abdomen was nontender.  Left CVA area tender.  Midline back nontender.  No abrasions or ecchymosis seen.  Patient moving all extremities.  Initially, GU exam was deferred based on shared decision made conversation.  Based on strong feel history of kidney stones and location of pain, have a higher suspicion for kidney stone over ovarian etiology.  Shared judgment conversation was held with patient we agreed to get urinalysis and labs and then a CT stone study initially.  If CT is concerning for ovarian etiology and no evidence of stone or his pain begins to radiate more to the groin, patient will likely need pelvic exam and transvaginal ultrasound to rule out torsion.  Patient did not want pain medicine on arrival.  Patient is tachycardic between 120 and 150 on  arrival although he feels is due to anxiety.  He denies any chest pain palpitations or shortness of breath.  Patient be given fluids for tachycardia and reassessed.   Heart rate improved after fluids.  Patient heart rate now in the 80s and 90s.  Urinalysis shows hemoglobin.  Small leukocytes and bacteria however given lack of dysuria, low suspicion for infection at this time.  Culture sent.  CT scan confirmed 1 mm kidney stone at left UVJ causing  his symptoms.  Mild hydro-.  Given the findings of stone, low suspicion for concomitant ovarian abnormality.  Ovaries appeared normal on CT scan.  Patient and I agreed to hold on pelvic and ultrasound at this time.  Given small stone and its near transit into the bladder, suspect patient is appropriate for discharge home.  Patient given prescription for pain medicine nausea medicine.  Patient will follow-up with his PCP for further management.  Patient was feeling better and agree with plan of care.  Patient discharged in good condition.  Final Clinical Impressions(s) / ED Diagnoses   Final diagnoses:  Flank pain  Kidney stone on left side    ED Discharge Orders         Ordered    HYDROcodone-acetaminophen (NORCO/VICODIN) 5-325 MG tablet  Every 4 hours PRN     06/12/18 1501    ondansetron (ZOFRAN) 4 MG tablet  Every 8 hours PRN     06/12/18 1501         Clinical Impression: 1. Flank pain   2. Kidney stone on left side     Disposition: Discharge  Condition: Good  I have discussed the results, Dx and Tx plan with the pt(& family if present). He/she/they expressed understanding and agree(s) with the plan. Discharge instructions discussed at great length. Strict return precautions discussed and pt &/or family have verbalized understanding of the instructions. No further questions at time of discharge.    Discharge Medication List as of 06/12/2018  3:03 PM    START taking these medications   Details  HYDROcodone-acetaminophen  (NORCO/VICODIN) 5-325 MG tablet Take 1 tablet by mouth every 4 (four) hours as needed., Starting Sun 06/12/2018, Normal    ondansetron (ZOFRAN) 4 MG tablet Take 1 tablet (4 mg total) by mouth every 8 (eight) hours as needed for nausea or vomiting., Starting Sun 06/12/2018, Normal        Follow Up: Deatra James, MD [address] [place] [address] [phone]     [facility] [address] [id number] YQ [place] [address] [phone]       Shanetha Bradham, Canary Brim, MD 06/12/18 [phone]

## 2018-06-13 LAB — URINE CULTURE: Culture: <10000 — AB

## 2018-07-11 MED FILL — SULFAMETHOXAZOLE-TMP DS TAB: 800-160 | 3 days supply | Qty: 6 | Fill #0

## 2018-07-17 ENCOUNTER — Telehealth: Payer: No Typology Code available for payment source | Admitting: Family

## 2018-07-17 DIAGNOSIS — R319 Hematuria, unspecified: Secondary | ICD-10-CM

## 2018-07-17 DIAGNOSIS — R300 Dysuria: Secondary | ICD-10-CM

## 2018-07-17 NOTE — Progress Notes (Signed)
Based on what you shared with me, I feel your condition warrants further evaluation and I recommend that you be seen for a face to face office visit.  Given your symptoms you need to be seen face to face. I reviewed your urine culture and it was negative for infection.    NOTE: If you entered your credit card information for this eVisit, you will not be charged. You may see a "hold" on your card for the $35 but that hold will drop off and you will not have a charge processed.  If you are having a true medical emergency please call 911.  If you need an urgent face to face visit, [facility] has four urgent care centers for your convenience.    PLEASE NOTE: THE [facility] LOCATIONS AND URGENT CARE CLINICS DO NOT HAVE THE TESTING FOR CORONAVIRUS COVID19 AVAILABLE.  IF YOU FEEL YOU NEED THIS TEST YOU MUST GO TO A TRIAGE LOCATION AT ONE OF THE HOSPITAL EMERGENCY DEPARTMENTS   [email or web address] to reserve your spot online an avoid wait times  [facility] [address], [address] [place], [place] [address] Modified hours of operation: Monday-Friday, 12 PM to 6 PM  Saturday & Sunday 10 AM to 4 PM *Across the street from [employer]  [facility] [employer] (New Address!) [address] [place], [place] [address] *Just off [employer], across the road from [place]* Modified hours of operation: Monday-Friday, 12 PM to 6 PM  Closed Saturday & Sunday  [facility]'s modified hours of operation will be in effect from May 1 until May 31   The following sites will take your insurance:  . [facility]  [phone] Get Driving Directions Find a Provider at this Location  [address] [place], [place] [address] . 10 am to 8 pm Monday-Friday . 12 pm to 8 pm Saturday-Sunday   . [facility] at [facility]  [phone] Get Driving Directions Find a Provider at this Location  [address], [address]  [place], [place] [address] . 8 am to 8 pm Monday-Friday . 9 am to 6 pm Saturday . 11 am to 6 pm Sunday   . [facility] at [facility]  [phone] Get Driving Directions  [address] [place], [place] [address] . 8 am to 8 pm Monday-Friday . 8 am to 4 pm Saturday-Sunday   Your e-visit answers were reviewed by a board certified advanced clinical practitioner to complete your personal care plan.  Thank you for using e-Visits.

## 2018-08-05 MED FILL — TESTOSTERONE CYPIONATE 200: 200 | 84 days supply | Qty: 5 | Fill #2

## 2018-08-26 MED FILL — SULFAMETHOXAZOLE-TMP DS TAB: 800-160 | 10 days supply | Qty: 20 | Fill #0

## 2018-09-05 MED FILL — SULFAMETHOXAZOLE-TMP DS TAB: 800-160 | 7 days supply | Qty: 14 | Fill #0

## 2018-11-09 ENCOUNTER — Other Ambulatory Visit: Payer: Self-pay | Admitting: Family Medicine

## 2018-11-18 ENCOUNTER — Telehealth: Payer: Self-pay | Admitting: *Deleted

## 2018-11-18 NOTE — Telephone Encounter (Signed)
Received fax from pharmacy, PA needed on testosterone.  Clinical questions submitted via Cover My Meds.  Waiting on response, this will be a fax and could take 1 - 5 days.  Cover My Meds info: Key: [id number]  Christen Bame, CMA

## 2018-11-24 MED FILL — TESTOSTERONE CYP 200 MG/ML: 200 | 28 days supply | Qty: 4 | Fill #0

## 2019-04-12 MED FILL — TESTOSTERONE CYP 200 MG/ML: 200 | 28 days supply | Qty: 4 | Fill #2

## 2019-06-14 ENCOUNTER — Other Ambulatory Visit: Payer: Self-pay | Admitting: Family Medicine

## 2019-06-15 MED FILL — TESTOSTERONE CYP 200 MG/ML: 200 | 28 days supply | Qty: 4 | Fill #0

## 2019-07-17 ENCOUNTER — Other Ambulatory Visit: Payer: Self-pay | Admitting: Urology

## 2019-07-17 MED FILL — TAMSULOSIN HCL 0.4 MG CAP: 0.4 | 30 days supply | Qty: 30 | Fill #0

## 2019-07-20 ENCOUNTER — Other Ambulatory Visit: Payer: Self-pay

## 2019-07-20 ENCOUNTER — Encounter (HOSPITAL_BASED_OUTPATIENT_CLINIC_OR_DEPARTMENT_OTHER): Payer: Self-pay | Admitting: Urology

## 2019-07-20 NOTE — Progress Notes (Signed)
Spoke with: Ernest Haynes NPO: No food after midnight/Clear liquids until 6:00 AM DOS Arrival time: 1000AM Lab needs dos----  N/A            Lab results------N/A COVID test ------07/25/19 Medications to take morning of surgery: None Pre op orders in epic Yes Diabetic medication -----N/A Patient Special Instructions -----N/A Pre-Op special Istructions -----N/A Ride home: [name] (mom) [phone]  Patient verbalized understanding of instructions that were given at this phone interview. Patient denies shortness of breath, chest pain, fever, cough a this phone interview.

## 2019-07-25 ENCOUNTER — Other Ambulatory Visit (HOSPITAL_COMMUNITY)
Admission: RE | Admit: 2019-07-25 | Discharge: 2019-07-25 | Disposition: A | Discharge status: Home / Self Care | Payer: No Typology Code available for payment source | Source: Ambulatory Visit | Attending: Urology | Admitting: Urology

## 2019-07-25 DIAGNOSIS — Z20822 Contact with and (suspected) exposure to covid-19: Secondary | ICD-10-CM | POA: Insufficient documentation

## 2019-07-25 DIAGNOSIS — Z01812 Encounter for preprocedural laboratory examination: Secondary | ICD-10-CM | POA: Insufficient documentation

## 2019-07-25 LAB — SARS CORONAVIRUS 2 (TAT 6-24 HRS): SARS Coronavirus 2: NEGATIVE

## 2019-07-28 ENCOUNTER — Ambulatory Visit (HOSPITAL_BASED_OUTPATIENT_CLINIC_OR_DEPARTMENT_OTHER): Payer: No Typology Code available for payment source | Admitting: Anesthesiology

## 2019-07-28 ENCOUNTER — Encounter (HOSPITAL_BASED_OUTPATIENT_CLINIC_OR_DEPARTMENT_OTHER): Payer: Self-pay | Admitting: Urology

## 2019-07-28 ENCOUNTER — Other Ambulatory Visit: Payer: Self-pay

## 2019-07-28 ENCOUNTER — Ambulatory Visit (HOSPITAL_BASED_OUTPATIENT_CLINIC_OR_DEPARTMENT_OTHER)
Admission: RE | Admit: 2019-07-28 | Discharge: 2019-07-28 | Disposition: A | Discharge status: Home / Self Care | Payer: No Typology Code available for payment source | Attending: Urology | Admitting: Urology

## 2019-07-28 ENCOUNTER — Encounter (HOSPITAL_BASED_OUTPATIENT_CLINIC_OR_DEPARTMENT_OTHER)
Admission: RE | Disposition: A | Discharge status: Home / Self Care | Payer: Self-pay | Source: Home / Self Care | Attending: Urology

## 2019-07-28 DIAGNOSIS — N200 Calculus of kidney: Secondary | ICD-10-CM

## 2019-07-28 DIAGNOSIS — N201 Calculus of ureter: Secondary | ICD-10-CM | POA: Insufficient documentation

## 2019-07-28 DIAGNOSIS — F17200 Nicotine dependence, unspecified, uncomplicated: Secondary | ICD-10-CM | POA: Diagnosis not present

## 2019-07-28 HISTORY — DX: Personal history of urinary calculi: Z87.442

## 2019-07-28 HISTORY — DX: Presence of spectacles and contact lenses: Z97.3

## 2019-07-28 HISTORY — PX: CYSTOSCOPY/URETEROSCOPY/HOLMIUM LASER/STENT PLACEMENT: SHX6546

## 2019-07-28 HISTORY — DX: Unspecified asthma, uncomplicated: J45.909

## 2019-07-28 SURGERY — CYSTOSCOPY/URETEROSCOPY/HOLMIUM LASER/STENT PLACEMENT
Anesthesia: General | Site: Pelvis | Laterality: Right

## 2019-07-28 MED ORDER — KETOROLAC TROMETHAMINE 30 MG/ML IJ SOLN: 30.0000 mg | Freq: Once | INTRAMUSCULAR | Status: DC | PRN

## 2019-07-28 MED ORDER — TAMSULOSIN HCL 0.4 MG PO CAPS: 0.4000 mg | ORAL_CAPSULE | Freq: Every evening | ORAL | Status: DC

## 2019-07-28 MED ORDER — CIPROFLOXACIN IN D5W 400 MG/200ML IV SOLN
400.0000 mg | INTRAVENOUS | Status: AC
  Administered 2019-07-28: 400 mg via INTRAVENOUS

## 2019-07-28 MED ORDER — OXYCODONE HCL 5 MG PO TABS
5.0000 mg | ORAL_TABLET | Freq: Once | ORAL | Status: AC
  Administered 2019-07-28: 5 mg via ORAL

## 2019-07-28 MED ORDER — MIDAZOLAM HCL 5 MG/5ML IJ SOLN
INTRAMUSCULAR | Status: DC | PRN
  Administered 2019-07-28: 2 mg via INTRAVENOUS

## 2019-07-28 MED ORDER — PROPOFOL 10 MG/ML IV BOLUS
INTRAVENOUS | Status: AC
  Filled 2019-07-28: qty 20

## 2019-07-28 MED ORDER — DEXAMETHASONE SODIUM PHOSPHATE 10 MG/ML IJ SOLN
INTRAMUSCULAR | Status: DC | PRN
  Administered 2019-07-28: 10 mg via INTRAVENOUS

## 2019-07-28 MED ORDER — OXYCODONE HCL 5 MG PO TABS
ORAL_TABLET | ORAL | Status: AC
  Filled 2019-07-28: qty 1

## 2019-07-28 MED ORDER — DEXAMETHASONE SODIUM PHOSPHATE 10 MG/ML IJ SOLN
INTRAMUSCULAR | Status: AC
  Filled 2019-07-28: qty 1

## 2019-07-28 MED ORDER — LACTATED RINGERS IV SOLN: INTRAVENOUS | Status: DC

## 2019-07-28 MED ORDER — ACETAMINOPHEN 10 MG/ML IV SOLN
INTRAVENOUS | Status: DC | PRN
  Administered 2019-07-28: 1000 mg via INTRAVENOUS

## 2019-07-28 MED ORDER — MIDAZOLAM HCL 2 MG/2ML IJ SOLN
INTRAMUSCULAR | Status: AC
  Filled 2019-07-28: qty 2

## 2019-07-28 MED ORDER — LIDOCAINE 2% (20 MG/ML) 5 ML SYRINGE
INTRAMUSCULAR | Status: AC
  Filled 2019-07-28: qty 5

## 2019-07-28 MED ORDER — PROPOFOL 10 MG/ML IV BOLUS
INTRAVENOUS | Status: DC | PRN
  Administered 2019-07-28: 180 mg via INTRAVENOUS

## 2019-07-28 MED ORDER — FENTANYL CITRATE (PF) 100 MCG/2ML IJ SOLN
INTRAMUSCULAR | Status: AC
  Filled 2019-07-28: qty 2

## 2019-07-28 MED ORDER — LIDOCAINE 2% (20 MG/ML) 5 ML SYRINGE
INTRAMUSCULAR | Status: DC | PRN
  Administered 2019-07-28: 100 mg via INTRAVENOUS

## 2019-07-28 MED ORDER — SODIUM CHLORIDE 0.9 % IR SOLN
Status: DC | PRN
  Administered 2019-07-28: 3000 mL

## 2019-07-28 MED ORDER — FENTANYL CITRATE (PF) 100 MCG/2ML IJ SOLN
INTRAMUSCULAR | Status: DC | PRN
  Administered 2019-07-28: 100 ug via INTRAVENOUS

## 2019-07-28 MED ORDER — BELLADONNA ALKALOIDS-OPIUM 16.2-60 MG RE SUPP
RECTAL | Status: DC | PRN
  Administered 2019-07-28: 1 via RECTAL

## 2019-07-28 MED ORDER — ONDANSETRON HCL 4 MG/2ML IJ SOLN
INTRAMUSCULAR | Status: AC
  Filled 2019-07-28: qty 2

## 2019-07-28 MED ORDER — ACETAMINOPHEN 10 MG/ML IV SOLN
INTRAVENOUS | Status: AC
  Filled 2019-07-28: qty 100

## 2019-07-28 MED ORDER — CIPROFLOXACIN IN D5W 400 MG/200ML IV SOLN
INTRAVENOUS | Status: AC
  Filled 2019-07-28: qty 200

## 2019-07-28 MED ORDER — CIPROFLOXACIN HCL 500 MG PO TABS
500.0000 mg | ORAL_TABLET | Freq: Once | ORAL | 0 refills | Status: AC
Start: 2019-07-28 — End: 2019-07-28

## 2019-07-28 MED ORDER — ONDANSETRON HCL 4 MG/2ML IJ SOLN
INTRAMUSCULAR | Status: DC | PRN
  Administered 2019-07-28: 4 mg via INTRAVENOUS

## 2019-07-28 MED ORDER — FENTANYL CITRATE (PF) 100 MCG/2ML IJ SOLN
25.0000 ug | INTRAMUSCULAR | Status: DC | PRN
  Administered 2019-07-28: 50 ug via INTRAVENOUS

## 2019-07-28 MED ORDER — IOHEXOL 300 MG/ML  SOLN
INTRAMUSCULAR | Status: DC | PRN
  Administered 2019-07-28: 3 mL via URETHRAL

## 2019-07-28 MED ORDER — BELLADONNA ALKALOIDS-OPIUM 16.2-60 MG RE SUPP
RECTAL | Status: AC
  Filled 2019-07-28: qty 1

## 2019-07-28 MED FILL — CIPROFLOXACIN HCL 500 MG TA: 500 | 1 days supply | Qty: 1 | Fill #0

## 2019-07-28 SURGICAL SUPPLY — 23 items
BAG DRAIN URO-CYSTO SKYTR STRL (DRAIN) ×3 IMPLANT
BAG DRN UROCATH (DRAIN) ×1
BASKET LASER NITINOL 1.9FR (BASKET) IMPLANT
BASKET STONE 1.7 NGAGE (UROLOGICAL SUPPLIES) ×2 IMPLANT
BSKT STON RTRVL 120 1.9FR (BASKET)
CATH URET 5FR 28IN OPEN ENDED (CATHETERS) ×3 IMPLANT
CATH URET DUAL LUMEN 6-10FR 50 (CATHETERS) IMPLANT
CLOTH BEACON ORANGE TIMEOUT ST (SAFETY) ×1 IMPLANT
EXTRACTOR STONE 1.7FRX115CM (UROLOGICAL SUPPLIES) IMPLANT
FIBER LASER FLEXIVA 365 (UROLOGICAL SUPPLIES) ×2 IMPLANT
FIBER LASER TRAC TIP (UROLOGICAL SUPPLIES) IMPLANT
GLOVE BIO SURGEON STRL SZ7.5 (GLOVE) ×3 IMPLANT
GOWN STRL REUS W/TWL XL LVL3 (GOWN DISPOSABLE) ×3 IMPLANT
GUIDEWIRE STR DUAL SENSOR (WIRE) ×3 IMPLANT
IV NS IRRIG 3000ML ARTHROMATIC (IV SOLUTION) ×4 IMPLANT
KIT TURNOVER CYSTO (KITS) ×3 IMPLANT
MANIFOLD NEPTUNE II (INSTRUMENTS) ×3 IMPLANT
NS IRRIG 500ML POUR BTL (IV SOLUTION) ×3 IMPLANT
STENT URET 6FRX26 CONTOUR (STENTS) ×2 IMPLANT
TRAY CYSTO PACK (CUSTOM PROCEDURE TRAY) ×3 IMPLANT
TUBE CONNECTING 12'X1/4 (SUCTIONS) ×1
TUBE CONNECTING 12X1/4 (SUCTIONS) ×1 IMPLANT
TUBING UROLOGY SET (TUBING) ×3 IMPLANT

## 2019-07-28 NOTE — Anesthesia Preprocedure Evaluation (Signed)
Anesthesia Evaluation  Patient identified by MRN, date of birth, ID band Patient awake    Reviewed: Allergy & Precautions, NPO status , Patient's Chart, lab work & pertinent test results  Airway Mallampati: II  TM Distance: >3 FB Neck ROM: Full    Dental no notable dental hx.    Pulmonary Current Smoker,    Pulmonary exam normal breath sounds clear to auscultation       Cardiovascular negative cardio ROS Normal cardiovascular exam Rhythm:Regular Rate:Normal     Neuro/Psych negative neurological ROS  negative psych ROS   GI/Hepatic negative GI ROS, Neg liver ROS,   Endo/Other  negative endocrine ROS  Renal/GU negative Renal ROS  negative genitourinary   Musculoskeletal negative musculoskeletal ROS (+)   Abdominal   Peds negative pediatric ROS (+)  Hematology negative hematology ROS (+)   Anesthesia Other Findings   Reproductive/Obstetrics negative OB ROS                             Anesthesia Physical Anesthesia Plan  ASA: I  Anesthesia Plan: General   Post-op Pain Management:    Induction: Intravenous  PONV Risk Score and Plan: 2 and Ondansetron, Dexamethasone and Treatment may vary due to age or medical condition  Airway Management Planned: LMA  Additional Equipment:   Intra-op Plan:   Post-operative Plan: Extubation in OR  Informed Consent: I have reviewed the patients History and Physical, chart, labs and discussed the procedure including the risks, benefits and alternatives for the proposed anesthesia with the patient or authorized representative who has indicated his/her understanding and acceptance.     Dental advisory given  Plan Discussed with: CRNA and Surgeon  Anesthesia Plan Comments:         Anesthesia Quick Evaluation

## 2019-07-28 NOTE — Interval H&P Note (Signed)
History and Physical Interval Note:  07/28/2019 11:33 AM  Ernest Haynes  has presented today for surgery, with the diagnosis of RIGHT URETERAL STONE.  The various methods of treatment have been discussed with the patient and family. After consideration of risks, benefits and other options for treatment, the patient has consented to  Procedure(s): RIGHT URETEROSCOPY HOLMIUM LASER STONE EXTRACTION STENT PLACEMENT (Right) as a surgical intervention.  The patient's history has been reviewed, patient examined, no change in status, stable for surgery.  I have reviewed the patient's chart and labs.  Questions were answered to the patient's satisfaction.     Crist Fat

## 2019-07-28 NOTE — Transfer of Care (Signed)
Immediate Anesthesia Transfer of Care Note  Patient: Ernest Haynes  Procedure(s) Performed: RIGHT URETEROSCOPY HOLMIUM LASER STONE EXTRACTION STENT PLACEMENT (Right Pelvis)  Patient Location: PACU  Anesthesia Type:General  Level of Consciousness: awake, alert  and oriented  Airway & Oxygen Therapy: Patient Spontanous Breathing and Patient connected to nasal cannula oxygen  Post-op Assessment: Report given to RN and Post -op Vital signs reviewed and stable  Post vital signs: Reviewed and stable  Last Vitals:  Vitals Value Taken Time  BP 113/61 07/28/19 1234  Temp    Pulse 105 07/28/19 1236  Resp 13 07/28/19 1236  SpO2 99 % 07/28/19 1236  Vitals shown include unvalidated device data.  Last Pain:  Vitals:   07/28/19 1041  TempSrc: Oral  PainSc: 0-No pain      Patients Stated Pain Goal: 5 (07/28/19 1041)  Complications: No apparent anesthesia complications

## 2019-07-28 NOTE — Anesthesia Procedure Notes (Signed)
Procedure Name: LMA Insertion Date/Time: 07/28/2019 11:51 AM Performed by: Dupree Givler D, CRNA Pre-anesthesia Checklist: Patient identified, Emergency Drugs available, Suction available and Patient being monitored Patient Re-evaluated:Patient Re-evaluated prior to induction Oxygen Delivery Method: Circle system utilized Preoxygenation: Pre-oxygenation with 100% oxygen Induction Type: IV induction Ventilation: Mask ventilation without difficulty LMA: LMA inserted LMA Size: 4.0 Tube type: Oral Number of attempts: 1 Placement Confirmation: positive ETCO2 and breath sounds checked- equal and bilateral Tube secured with: Tape Dental Injury: Teeth and Oropharynx as per pre-operative assessment

## 2019-07-28 NOTE — Op Note (Signed)
Preoperative diagnosis: right ureteral calculus  Postoperative diagnosis: right ureteral calculus  Procedure:  1. Cystoscopy 2. right ureteroscopy and stone removal 3. Ureteroscopic laser lithotripsy 4. right 11F x 26cm ureteral stent placement  5. right retrograde pyelography with interpretation  Surgeon: Crist Fat, MD  Anesthesia: General  Complications: None  Intraoperative findings: right retrograde pyelography demonstrated a filling defect within the right ureter consistent with the patient's known calculus without other abnormalities.  EBL: Minimal  Specimens: 1. right ureteral calculus  Disposition of specimens: [facility] for stone analysis  Indication: Ernest Haynes is a 23 y.o.   patient with a right ureteral stone and associated right symptoms. After reviewing the management options for treatment, the patient elected to proceed with the above surgical procedure(s). We have discussed the potential benefits and risks of the procedure, side effects of the proposed treatment, the likelihood of the patient achieving the goals of the procedure, and any potential problems that might occur during the procedure or recuperation. Informed consent has been obtained.   Description of procedure:  The patient was taken to the operating room and general anesthesia was induced.  The patient was placed in the dorsal lithotomy position, prepped and draped in the usual sterile fashion, and preoperative antibiotics were administered. A preoperative time-out was performed.   Cystourethroscopy was performed.  The patient's urethra was examined and was normal.  The bladder was then systematically examined in its entirety. There was no evidence for any bladder tumors, stones, or other mucosal pathology.    Attention then turned to the right ureteral orifice and a ureteral catheter was used to intubate the ureteral orifice.  Omnipaque contrast was injected  through the ureteral catheter and a retrograde pyelogram was performed with findings as dictated above.  A 0.38 sensor guidewire was then advanced up the right ureter into the renal pelvis under fluoroscopic guidance. The 6 Fr semirigid ureteroscope was then advanced into the ureter next to the guidewire and the calculus was identified.   The stone was then fragmented with the 365 micron holmium laser fiber on a setting of 0.6 and frequency of 6 Hz.   All stones were then removed from the ureter with an N-gage nitinol basket.  Reinspection of the ureter revealed no remaining visible stones or fragments.   The wire was then backloaded through the cystoscope and a ureteral stent was advance over the wire using Seldinger technique.  The stent was positioned appropriately under fluoroscopic and cystoscopic guidance.  The wire was then removed with an adequate stent curl noted in the renal pelvis as well as in the bladder.  The bladder was then emptied and the procedure ended.  The patient appeared to tolerate the procedure well and without complications.  The patient was able to be awakened and transferred to the recovery unit in satisfactory condition.   Disposition: The tether of the stent was left on and cut short at the urethral meatus.  Instructions for removing the stent have been provided to the patient. The patient has been scheduled for followup in 6 weeks with a renal ultrasound.

## 2019-07-28 NOTE — Discharge Instructions (Signed)
DISCHARGE INSTRUCTIONS FOR KIDNEY STONE/URETERAL STENT   MEDICATIONS:  1. Resume all your other meds from home - you may use the extra narcotic pain meds that you have at home.  2. AZO OTC is to help with the burning/stinging when you urinate. 3. Take Cipro one hour prior to removal of your stent.   ACTIVITY:  1. No strenuous activity x 1week  2. No driving while on narcotic pain medications  3. Drink plenty of water  4. Continue to walk at home - you can still get blood clots when you are at home, so keep active, but don't over do it.  5. May return to work/school tomorrow or when you feel ready   BATHING:  1. You can shower and we recommend daily showers  2. You have a string coming from your urethra: The stent string is attached to your ureteral stent. Do not pull on this.   SIGNS/SYMPTOMS TO CALL:  Please call us if you have a fever greater than 101.5, uncontrolled nausea/vomiting, uncontrolled pain, dizziness, unable to urinate, bloody urine, chest pain, shortness of breath, leg swelling, leg pain, redness around wound, drainage from wound, or any other concerns or questions.   You can reach [place] at [phone].   FOLLOW-UP:  1. You have an appointment in 6 weeks with a ultrasound of your kidneys prior.  2. You have a string attached to your stent, you may remove it on Monday, May 31st. To do this, pull the strings until the stents are completely removed. You may feel an odd sensation in your back.   Post Anesthesia Home Care Instructions  Activity: Get plenty of rest for the remainder of the day. A responsible individual must stay with you for 24 hours following the procedure.  For the next 24 hours, DO NOT: -Drive a car -Advertising copywriter -Drink alcoholic beverages -Take any medication unless instructed by your physician -Make any legal decisions or sign important papers.  Meals: Start with liquid foods such as gelatin or soup. Progress to regular foods as tolerated.  Avoid greasy, spicy, heavy foods. If nausea and/or vomiting occur, drink only clear liquids until the nausea and/or vomiting subsides. Call your physician if vomiting continues.  Special Instructions/Symptoms: Your throat may feel dry or sore from the anesthesia or the breathing tube placed in your throat during surgery. If this causes discomfort, gargle with warm salt water. The discomfort should disappear within 24 hours.

## 2019-07-28 NOTE — H&P (Signed)
Non-obstructing stone follow-up  HPI: Ernest Haynes is a 24 year-old male established patient who is here today for interval evaluation of non-obstructing kidney stones.  The patient was last seen 10/20.   The patient has not passed any stones since they were last seen. He has had any flank pain since in the interval. The patient states the pain occurs occasionally. The patient had 37mm right lower pole stone.   The patient denies any progressive voiding symptoms. He does have dysuria. He does have hematuria. He has not had fever and chills.   He has not had kidney stone surgery.   The patient has not completed a 24 hour urine collection in the past. The patient is not taking any medications for stone prevention.   Patient states that over the past 3 weeks he has had several episodes of flank pain and hematuria. He denies any ongoing flank pain.     ALLERGIES: meloxicam Omnicef    MEDICATIONS: Testosterone Cypionate     GU PSH: None   NON-GU PSH: None   GU PMH: Renal calculus (Stable), Right, His right renal stone could not be visualized today. He does not appear to have developed any new stones and therefore this appears to be a metabolically inactive situation. He will return again in 1 year for a final KUB to assure stability. - 12/20/2018, (Stable), I can not say for sure that his stone is no longer in his right kidney due to overlying bowel gas but he had no other right or left renal calculi other than the single right stone seen on his previous CT scan., - 07/26/2018, Right, He has a right renal calculus. I have supplied him with information on stone prevention and will have him return in 6 months for a KUB to assure stability. I told him that if he demonstrates metabolic stone activity I would then undertake a metabolic stone evaluation., - 06/21/2018 Ureteral calculus (Acute), Left, He has passed his left ureteral stone. It will be sent for compositional analysis. - 06/21/2018       PMH Notes: Larina Bras analysis: Calcium oxalate.   NON-GU PMH: None   FAMILY HISTORY: None   SOCIAL HISTORY: Marital Status: Single    REVIEW OF SYSTEMS:    GU Review Male:   Patient reports burning/ pain with urination and trouble starting your stream. Patient denies frequent urination, hard to postpone urination, get up at night to urinate, leakage of urine, stream starts and stops, have to strain to urinate , erection problems, and penile pain.  Gastrointestinal (Upper):   Patient denies nausea, vomiting, and indigestion/ heartburn.  Gastrointestinal (Lower):   Patient denies diarrhea and constipation.  Constitutional:   Patient denies fever, night sweats, weight loss, and fatigue.  Skin:   Patient denies skin rash/ lesion and itching.  Eyes:   Patient denies blurred vision and double vision.  Ears/ Nose/ Throat:   Patient denies sore throat and sinus problems.  Hematologic/Lymphatic:   Patient denies swollen glands and easy bruising.  Cardiovascular:   Patient denies leg swelling and chest pains.  Respiratory:   Patient denies cough and shortness of breath.  Endocrine:   Patient denies excessive thirst.  Musculoskeletal:   Patient denies back pain and joint pain.  Neurological:   Patient denies headaches and dizziness.  Psychologic:   Patient denies depression and anxiety.   VITAL SIGNS:      07/17/2019 10:32 AM  Weight 220 lb / 99.79 kg  Height 70 in / 177.8  cm  BP 107/72 mmHg  Heart Rate 102 /min  Temperature 98.2 F / 36.7 C  BMI 31.6 kg/m   MULTI-SYSTEM PHYSICAL EXAMINATION:    Constitutional: Well-nourished. No physical deformities. Normally developed. Good grooming.  Neck: Neck symmetrical, not swollen. Normal tracheal position.  Respiratory: No labored breathing, no use of accessory muscles.   Cardiovascular: Normal temperature, normal extremity pulses, no swelling, no varicosities.  Lymphatic: No enlargement of neck, axillae, groin.  Skin: No paleness, no  jaundice, no cyanosis. No lesion, no ulcer, no rash.  Neurologic / Psychiatric: Oriented to time, oriented to place, oriented to person. No depression, no anxiety, no agitation.  Gastrointestinal: No mass, no tenderness, no rigidity, non obese abdomen.  Eyes: Normal conjunctivae. Normal eyelids.  Ears, Nose, Mouth, and Throat: Left ear no scars, no lesions, no masses. Right ear no scars, no lesions, no masses. Nose no scars, no lesions, no masses. Normal hearing. Normal lips.  Musculoskeletal: Normal gait and station of head and neck.     Complexity of Data:  Source Of History:  Patient  Records Review:   Previous Doctor Records, Previous Patient Records, POC Tool  X-Ray Review: KUB: Reviewed Films. Discussed With Patient.  C.T. Abdomen/Pelvis: Reviewed Films. Discussed With Patient.     PROCEDURES:         C.T. Urogram - [id number]  The patient has a 5 mm stone at the right UVJ with mild hydronephrosis proximally.      Patient confirmed No Neulasta OnPro Device.         Urinalysis w/Scope Micro  WBC/hpf: 0 - 5/hpf  RBC/hpf: 0 - 2/hpf  Bacteria: NS (Not Seen)  Cystals: NS (Not Seen)  Casts: NS (Not Seen)  Trichomonas: Not Present  Mucous: Not Present  Epithelial Cells: NS (Not Seen)  Yeast: NS (Not Seen)  Sperm: Not Present    ASSESSMENT:      ICD-10 Details  1 GU:   Renal calculus - N20.0   2   Gross hematuria - R31.0    PLAN:            Medications New Meds: Tamsulosin Hcl 0.4 mg capsule 1 capsule PO Daily   #30  0 Refill(s)  Ultram 50 mg tablet 1-2 tablet PO Q 6 H   #10  0 Refill(s)            Orders Labs Urine Culture  X-Rays: C.T. Stone Protocol Without Contrast  X-Ray Notes: History:   Hematuria: Yes/No  Patient to see MD after exam: Yes/No  Previous exam: CT / IVP/ US/ KUB/ None  When:  Where:  Diabetic: Yes/ No  High Blood Pressure: Yes/ No  BUN/ Creatine:  Date of last BUN Creatinine:  Weight in pounds:  Allergy- Contrasts/ Shellfish:  Yes/ No  Conflicting diabetic meds: Yes/ No  Oral contrast and instructions given to patient:   Prior Authorization #: Auth# [id number] per [place] Letter(s):  Created for Patient: Clinical Summary         Notes:   The stones in the patient's right kidney of now progressed down into the ureter. There are now down close to the bladder at the UVJ. The patient is headed to a Room but on June 6th, and we discussed treatment options with this time frame. I recommended that we schedule him for ureteroscopy to remove the stones prior to  him leaving the country. Will try to get this scheduled within the next few weeks, hopefully can passed the stones before then.

## 2019-07-28 NOTE — Anesthesia Postprocedure Evaluation (Signed)
Anesthesia Post Note  Patient: KENO CARAWAY  Procedure(s) Performed: RIGHT URETEROSCOPY HOLMIUM LASER STONE EXTRACTION STENT PLACEMENT (Right Pelvis)     Patient location during evaluation: PACU Anesthesia Type: General Level of consciousness: awake and alert Pain management: pain level controlled Vital Signs Assessment: post-procedure vital signs reviewed and stable Respiratory status: spontaneous breathing, nonlabored ventilation, respiratory function stable and patient connected to nasal cannula oxygen Cardiovascular status: blood pressure returned to baseline and stable Postop Assessment: no apparent nausea or vomiting Anesthetic complications: no    Last Vitals:  Vitals:   07/28/19 1300 07/28/19 1347  BP: 117/75 106/67  Pulse: (!) 103 67  Resp: 10 16  Temp:  36.7 C  SpO2: 98% 99%    Last Pain:  Vitals:   07/28/19 1347  TempSrc: Oral  PainSc: 2                  Kentrel Clevenger S

## 2019-08-01 MED FILL — ONDANSETRON ODT 8 MG TABLET: 8 | 6 days supply | Qty: 20 | Fill #0

## 2019-08-01 MED FILL — HYDROCODON-APAP 5-325: 5-325 | 2 days supply | Qty: 8 | Fill #0

## 2019-08-29 MED FILL — TESTOSTERONE CYP 200 MG/ML: 200 | 28 days supply | Qty: 4 | Fill #1

## 2019-09-11 ENCOUNTER — Other Ambulatory Visit: Payer: Self-pay | Admitting: Family Medicine

## 2019-09-11 DIAGNOSIS — R1010 Upper abdominal pain, unspecified: Secondary | ICD-10-CM

## 2019-09-11 DIAGNOSIS — R197 Diarrhea, unspecified: Secondary | ICD-10-CM

## 2019-09-21 ENCOUNTER — Ambulatory Visit
Admission: RE | Admit: 2019-09-21 | Discharge: 2019-09-21 | Disposition: A | Discharge status: Home / Self Care | Payer: No Typology Code available for payment source | Source: Ambulatory Visit | Attending: Family Medicine | Admitting: Family Medicine

## 2019-09-21 DIAGNOSIS — R197 Diarrhea, unspecified: Secondary | ICD-10-CM

## 2019-09-21 DIAGNOSIS — R1010 Upper abdominal pain, unspecified: Secondary | ICD-10-CM

## 2019-11-08 ENCOUNTER — Other Ambulatory Visit (HOSPITAL_COMMUNITY): Payer: Self-pay | Admitting: Physician Assistant

## 2019-11-08 MED FILL — HYOSCYAMINE SULF 0.125 MG T: 0.125 | 15 days supply | Qty: 90 | Fill #0

## 2019-11-30 MED FILL — TESTOSTERONE CYP 200 MG/ML: 200 | 7 days supply | Qty: 1 | Fill #2

## 2019-12-01 ENCOUNTER — Telehealth: Payer: Self-pay

## 2019-12-01 NOTE — Telephone Encounter (Signed)
Patient called requesting a PA for testosterone. I completed PA in covermymeds with an instant approval. The patient and pharmacy have been updated.

## 2019-12-11 ENCOUNTER — Other Ambulatory Visit (HOSPITAL_COMMUNITY): Payer: Self-pay | Admitting: Physician Assistant

## 2019-12-11 ENCOUNTER — Other Ambulatory Visit: Payer: Self-pay | Admitting: Physician Assistant

## 2019-12-11 DIAGNOSIS — R1011 Right upper quadrant pain: Secondary | ICD-10-CM

## 2019-12-15 ENCOUNTER — Other Ambulatory Visit: Payer: Self-pay | Admitting: Family Medicine

## 2019-12-19 ENCOUNTER — Ambulatory Visit (HOSPITAL_COMMUNITY): Payer: No Typology Code available for payment source

## 2019-12-25 MED ORDER — TESTOSTERONE CYPIONATE 200 MG/ML IM SOLN: INTRAMUSCULAR | 3 refills | Status: DC

## 2019-12-25 MED FILL — HYOSCYAMINE SULF 0.125 MG T: 0.125 | 15 days supply | Qty: 90 | Fill #1

## 2019-12-25 NOTE — Addendum Note (Signed)
Addended by: Steva Colder on: 12/25/2019 08:38 AM   Modules accepted: Orders

## 2019-12-25 NOTE — Telephone Encounter (Signed)
Medication was never received by the pharmacy pm 10/15 due to prescription outage. I have resent this.

## 2020-01-02 ENCOUNTER — Other Ambulatory Visit: Payer: Self-pay

## 2020-01-02 ENCOUNTER — Ambulatory Visit (HOSPITAL_COMMUNITY)
Admission: RE | Admit: 2020-01-02 | Discharge: 2020-01-02 | Disposition: A | Discharge status: Home / Self Care | Payer: No Typology Code available for payment source | Source: Ambulatory Visit | Attending: Physician Assistant | Admitting: Physician Assistant

## 2020-01-02 DIAGNOSIS — R1011 Right upper quadrant pain: Secondary | ICD-10-CM | POA: Diagnosis present

## 2020-01-02 MED ORDER — TECHNETIUM TC 99M MEBROFENIN IV KIT
5.4000 | PACK | Freq: Once | INTRAVENOUS | Status: AC | PRN
  Administered 2020-01-02: 5.4 via INTRAVENOUS

## 2020-01-29 MED FILL — HYOSCYAMINE SULF 0.125 MG T: 0.125 | 15 days supply | Qty: 90 | Fill #2

## 2020-01-30 ENCOUNTER — Other Ambulatory Visit: Payer: Self-pay | Admitting: Family Medicine

## 2020-01-30 MED FILL — TESTOSTERONE CYP 200 MG/ML: 200 | 17 days supply | Qty: 1 | Fill #0

## 2020-02-16 MED FILL — TESTOSTERONE CYP 200 MG/ML: 200 | 7 days supply | Qty: 1 | Fill #1

## 2020-03-06 ENCOUNTER — Other Ambulatory Visit (HOSPITAL_COMMUNITY): Payer: Self-pay | Admitting: Physician Assistant

## 2020-03-06 MED FILL — HYOSCYAMINE SULF 0.125 MG T: 0.125 | 15 days supply | Qty: 90 | Fill #0

## 2020-03-10 MED FILL — TESTOSTERONE CYP 200 MG/ML: 200 | 7 days supply | Qty: 1 | Fill #2

## 2020-04-05 MED FILL — TESTOSTERONE CYP 200 MG/ML: 200 | 7 days supply | Qty: 1 | Fill #3

## 2020-04-22 ENCOUNTER — Other Ambulatory Visit: Payer: Self-pay | Admitting: Family Medicine

## 2020-04-23 ENCOUNTER — Other Ambulatory Visit: Payer: Self-pay | Admitting: Family Medicine

## 2020-04-23 MED FILL — TESTOSTERONE CYP 200 MG/ML: 200 | 7 days supply | Qty: 1 | Fill #0

## 2020-05-10 MED FILL — TESTOSTERONE CYP 200 MG/ML: 200 | 7 days supply | Qty: 1 | Fill #1

## 2020-05-31 MED FILL — TESTOSTERONE CYP 200 MG/ML: 200 | 7 days supply | Qty: 1 | Fill #2

## 2020-06-21 MED FILL — Testosterone Cypionate IM Inj in Oil 200 MG/ML: INTRAMUSCULAR | 7 days supply | Qty: 1 | Fill #0 | Status: AC

## 2020-06-22 ENCOUNTER — Other Ambulatory Visit (HOSPITAL_COMMUNITY): Payer: Self-pay

## 2020-07-17 ENCOUNTER — Other Ambulatory Visit: Payer: Self-pay | Admitting: Family Medicine

## 2020-07-17 ENCOUNTER — Other Ambulatory Visit (HOSPITAL_COMMUNITY): Payer: Self-pay

## 2020-07-18 ENCOUNTER — Other Ambulatory Visit (HOSPITAL_COMMUNITY): Payer: Self-pay

## 2020-07-18 MED ORDER — TAMSULOSIN HCL 0.4 MG PO CAPS
ORAL_CAPSULE | ORAL | 2 refills | Status: DC
  Filled 2020-07-18: qty 30, 30d supply, fill #0

## 2020-07-18 MED ORDER — TESTOSTERONE CYPIONATE 200 MG/ML IM SOLN
INTRAMUSCULAR | 3 refills | Status: DC
  Filled 2020-07-18: qty 1, 7d supply, fill #0
  Filled 2020-08-09: qty 1, 7d supply, fill #1
  Filled 2020-08-29: qty 1, 7d supply, fill #2
  Filled 2020-09-13: qty 1, 7d supply, fill #3

## 2020-08-09 ENCOUNTER — Other Ambulatory Visit (HOSPITAL_COMMUNITY): Payer: Self-pay

## 2020-08-30 ENCOUNTER — Other Ambulatory Visit (HOSPITAL_COMMUNITY): Payer: Self-pay

## 2020-09-13 ENCOUNTER — Other Ambulatory Visit (HOSPITAL_COMMUNITY): Payer: Self-pay

## 2020-10-01 ENCOUNTER — Other Ambulatory Visit (HOSPITAL_COMMUNITY): Payer: Self-pay

## 2020-10-01 MED ORDER — ESCITALOPRAM OXALATE 5 MG PO TABS
ORAL_TABLET | ORAL | 0 refills | Status: DC
  Filled 2020-10-01: qty 30, 30d supply, fill #0

## 2020-10-18 ENCOUNTER — Other Ambulatory Visit (HOSPITAL_COMMUNITY): Payer: Self-pay

## 2020-10-18 ENCOUNTER — Other Ambulatory Visit: Payer: Self-pay | Admitting: Family Medicine

## 2020-10-19 ENCOUNTER — Other Ambulatory Visit (HOSPITAL_COMMUNITY): Payer: Self-pay

## 2020-10-21 ENCOUNTER — Other Ambulatory Visit (HOSPITAL_COMMUNITY): Payer: Self-pay

## 2020-10-21 ENCOUNTER — Other Ambulatory Visit: Payer: Self-pay | Admitting: Family Medicine

## 2020-10-21 MED FILL — Testosterone Cypionate IM Inj in Oil 200 MG/ML: INTRAMUSCULAR | 7 days supply | Qty: 1 | Fill #0 | Status: AC

## 2020-10-22 ENCOUNTER — Other Ambulatory Visit (HOSPITAL_COMMUNITY): Payer: Self-pay

## 2020-10-25 ENCOUNTER — Other Ambulatory Visit (HOSPITAL_COMMUNITY): Payer: Self-pay

## 2020-10-29 ENCOUNTER — Other Ambulatory Visit (HOSPITAL_COMMUNITY): Payer: Self-pay

## 2020-10-29 MED ORDER — ESCITALOPRAM OXALATE 5 MG PO TABS
5.0000 mg | ORAL_TABLET | Freq: Every day | ORAL | 1 refills | Status: DC
  Filled 2020-10-29: qty 30, 30d supply, fill #0
  Filled 2021-09-20: qty 30, 30d supply, fill #1

## 2020-11-05 ENCOUNTER — Other Ambulatory Visit (HOSPITAL_COMMUNITY): Payer: Self-pay

## 2020-11-05 MED ORDER — ESCITALOPRAM OXALATE 10 MG PO TABS
10.0000 mg | ORAL_TABLET | Freq: Every day | ORAL | 1 refills | Status: DC
  Filled 2020-11-05: qty 30, 30d supply, fill #0
  Filled 2020-12-05: qty 30, 30d supply, fill #1

## 2020-11-08 ENCOUNTER — Other Ambulatory Visit (HOSPITAL_COMMUNITY): Payer: Self-pay

## 2020-11-08 MED FILL — Testosterone Cypionate IM Inj in Oil 200 MG/ML: INTRAMUSCULAR | 7 days supply | Qty: 1 | Fill #1 | Status: AC

## 2020-11-15 ENCOUNTER — Ambulatory Visit: Payer: No Typology Code available for payment source | Admitting: Family Medicine

## 2020-11-28 MED FILL — Testosterone Cypionate IM Inj in Oil 200 MG/ML: INTRAMUSCULAR | 7 days supply | Qty: 1 | Fill #2 | Status: AC

## 2020-11-29 ENCOUNTER — Other Ambulatory Visit (HOSPITAL_COMMUNITY): Payer: Self-pay

## 2020-12-06 ENCOUNTER — Other Ambulatory Visit (HOSPITAL_COMMUNITY): Payer: Self-pay

## 2020-12-13 ENCOUNTER — Other Ambulatory Visit (HOSPITAL_COMMUNITY): Payer: Self-pay

## 2020-12-13 MED ORDER — TESTOSTERONE CYPIONATE 200 MG/ML IM SOLN
INTRAMUSCULAR | 3 refills | Status: AC
  Filled 2020-12-13: qty 2, 28d supply, fill #0
  Filled 2021-01-18: qty 2, 14d supply, fill #1

## 2021-01-07 ENCOUNTER — Other Ambulatory Visit (HOSPITAL_COMMUNITY): Payer: Self-pay

## 2021-01-07 MED ORDER — ESCITALOPRAM OXALATE 10 MG PO TABS
10.0000 mg | ORAL_TABLET | Freq: Every day | ORAL | 0 refills | Status: DC
  Filled 2021-01-07 – 2021-01-18 (×2): qty 90, 90d supply, fill #0

## 2021-01-16 ENCOUNTER — Other Ambulatory Visit (HOSPITAL_COMMUNITY): Payer: Self-pay

## 2021-01-18 ENCOUNTER — Other Ambulatory Visit (HOSPITAL_COMMUNITY): Payer: Self-pay

## 2021-02-11 ENCOUNTER — Other Ambulatory Visit (HOSPITAL_COMMUNITY): Payer: Self-pay

## 2021-02-11 MED ORDER — "BD LUER-LOK SYRINGE 25G X 1"" 3 ML MISC"
0 refills | Status: AC
  Filled 2021-02-11: qty 12, 84d supply, fill #0

## 2021-02-11 MED ORDER — TESTOSTERONE CYPIONATE 200 MG/ML IM SOLN
INTRAMUSCULAR | 3 refills | Status: DC
  Filled 2021-02-11: qty 5, 87d supply, fill #0
  Filled 2021-05-07: qty 1, 7d supply, fill #1
  Filled 2021-05-27: qty 6, 42d supply, fill #2

## 2021-02-11 MED ORDER — "BD LUER-LOK SYRINGE 18G X 1-1/2"" 3 ML MISC"
3 refills | Status: AC
  Filled 2021-02-11: qty 12, 84d supply, fill #0
  Filled 2021-05-27: qty 12, 84d supply, fill #1

## 2021-02-11 MED ORDER — NORETHINDRONE 0.35 MG PO TABS
ORAL_TABLET | ORAL | 3 refills | Status: DC
  Filled 2021-02-11: qty 28, 28d supply, fill #0
  Filled 2021-03-13: qty 28, 28d supply, fill #1
  Filled 2021-04-09: qty 28, 28d supply, fill #2
  Filled 2021-05-07: qty 28, 28d supply, fill #3

## 2021-02-15 ENCOUNTER — Other Ambulatory Visit (HOSPITAL_COMMUNITY): Payer: Self-pay

## 2021-03-02 ENCOUNTER — Telehealth: Payer: No Typology Code available for payment source | Admitting: Emergency Medicine

## 2021-03-02 DIAGNOSIS — J019 Acute sinusitis, unspecified: Secondary | ICD-10-CM

## 2021-03-02 MED ORDER — AMOXICILLIN-POT CLAVULANATE 875-125 MG PO TABS: 1.0000 | ORAL_TABLET | Freq: Two times a day (BID) | ORAL | 0 refills | Status: DC

## 2021-03-02 NOTE — Patient Instructions (Signed)
Ernest Haynes, thank you for joining Rennis Harding, PA-C for today's virtual visit.  While this provider is not your primary care provider (PCP), if your PCP is located in our provider database this encounter information will be shared with them immediately following your visit.  Consent: (Patient) Ernest Haynes provided verbal consent for this virtual visit at the beginning of the encounter.  Current Medications:  Current Outpatient Medications:    amoxicillin-clavulanate (AUGMENTIN) 875-125 MG tablet, Take 1 tablet by mouth 2 (two) times daily., Disp: 20 tablet, Rfl: 0   escitalopram (LEXAPRO) 10 MG tablet, Take 1 tablet by mouth once daily, Disp: 90 tablet, Rfl: 0   escitalopram (LEXAPRO) 5 MG tablet, Take 1 tablet by mouth once daily, Disp: 30 tablet, Rfl: 1   HYDROcodone-acetaminophen (NORCO/VICODIN) 5-325 MG tablet, Take 1 tablet by mouth every 4 (four) hours as needed., Disp: 10 tablet, Rfl: 0   hyoscyamine (LEVSIN) 0.125 MG tablet, TAKE 1 TO 2 TABLETS BY MOUTH 3 TIMES DAILY AS NEEDED FOR ABDOMINAL CRAMPING, Disp: 90 tablet, Rfl: 2   hyoscyamine (LEVSIN) 0.125 MG tablet, TAKE 1 TO 2 TABLETS BY MOUTH THREE TIMES DAILY AS NEEDED FOR ABDOMINAL CRAMPING, Disp: 90 tablet, Rfl: 2   ibuprofen (ADVIL) 400 MG tablet, Take 400 mg by mouth every 6 (six) hours as needed., Disp: , Rfl:    Lactobacillus (PROBIOTIC ACIDOPHILUS PO), Take by mouth every evening., Disp: , Rfl:    melatonin 1 MG TABS tablet, Take by mouth every evening. 1.5 mg, Disp: , Rfl:    naproxen (NAPROSYN) 500 MG tablet, Take 500 mg by mouth as needed. , Disp: , Rfl:    NEEDLE, DISP, 18 G (B-D BLUNT FILL NEEDLE) 18G X 1-1/2" MISC, Use as directed for testosterone injectioins, Disp: 50 each, Rfl: 3   NEEDLE, DISP, 25 G (B-D HYPODERMIC NEEDLE 25GX1") 25G X 1" MISC, Use for testosterone injection as directed, Disp: 50 each, Rfl: 3   SYRINGE-NEEDLE, DISP, 3 ML (B-D 3CC LUER-LOK SYR 25GX1") 25G X 1" 3 ML MISC, Use as  directed to inject intramuscularly once a week, Disp: 12 each, Rfl: 0   norethindrone (MICRONOR) 0.35 MG tablet, Take 1 tablet by mouth daily, Disp: 28 tablet, Rfl: 3   Syringe, Disposable, (B-D SYRINGE LUER-LOK 1CC) 1 ML MISC, Use as directed for testosterone injection, Disp: 50 each, Rfl: 3   SYRINGE-NEEDLE, DISP, 3 ML (B-D 3CC LUER-LOK SYR 18GX1-1/2) 18G X 1-1/2" 3 ML MISC, Use as directed into the muscle once a week, Disp: 12 each, Rfl: 3   tamsulosin (FLOMAX) 0.4 MG CAPS capsule, Take 1 capsule (0.4 mg total) by mouth every evening. Take until the stent is removed., Disp: 30 capsule, Rfl:    tamsulosin (FLOMAX) 0.4 MG CAPS capsule, Take 1 capsule by mouth daily, Disp: 30 capsule, Rfl: 2   testosterone cypionate (DEPOTESTOSTERONE CYPIONATE) 200 MG/ML injection, INJECT 0.4 MLS INTO THE MUSCLE ONCE A WEEK, Disp: 1 mL, Rfl: 3   testosterone cypionate (DEPOTESTOSTERONE CYPIONATE) 200 MG/ML injection, Inject 0.4 ml into the muscle once a week., Disp: 2 mL, Rfl: 3   testosterone cypionate (DEPOTESTOSTERONE CYPIONATE) 200 MG/ML injection, Inject 0.60ml into the muscle once a week, Disp: 6 mL, Rfl: 3   Medications ordered in this encounter:  Meds ordered this encounter  Medications   amoxicillin-clavulanate (AUGMENTIN) 875-125 MG tablet    Sig: Take 1 tablet by mouth 2 (two) times daily.    Dispense:  20 tablet    Refill:  0  Order Specific Question:   Supervising Provider    Answer:   Eber Hong [[id number]]     *If you need refills on other medications prior to your next appointment, please contact your pharmacy*  Follow-Up: Call back or seek an in-person evaluation if the symptoms worsen or if the condition fails to improve as anticipated.  Other Instructions Rest and push fluids Augmentin prescribed.  Take as directed and to completion.  Patient reports rash as child with cefdinir, otherwise denies allergy to penicillin class Continue with OTC ibuprofen/tylenol as needed for pain Follow  up with PCP as needed Follow up in person with urgent care or go to the ED if you have any new or worsening symptoms such as fever, chills, worsening sinus pain/pressure, cough, sore throat, chest pain, shortness of breath, abdominal pain, changes in bowel or bladder habits, etc...    If you have been instructed to have an in-person evaluation today at a local Urgent Care facility, please use the link below. It will take you to a list of all of our available [facility], including address, phone number and hours of operation. Please do not delay care.  [facility]  If you or a family member do not have a primary care provider, use the link below to schedule a visit and establish care. When you choose a [facility] primary care physician or advanced practice provider, you gain a long-term partner in health. Find a Primary Care Provider  Learn more about [facility]'s in-office and virtual care options: [facility] - Get Care Now

## 2021-03-02 NOTE — Progress Notes (Signed)
Virtual Visit Consent   Ernest BudLevi G Ritter, you are scheduled for a virtual visit with a [facility] provider today.     Just as with appointments in the office, your consent must be obtained to participate.  Your consent will be active for this visit and any virtual visit you may have with one of our providers in the next 365 days.     If you have a MyChart account, a copy of this consent can be sent to you electronically.  All virtual visits are billed to your insurance company just like a traditional visit in the office.    As this is a virtual visit, video technology does not allow for your provider to perform a traditional examination.  This may limit your provider's ability to fully assess your condition.  If your provider identifies any concerns that need to be evaluated in person or the need to arrange testing (such as labs, EKG, etc.), we will make arrangements to do so.     Although advances in technology are sophisticated, we cannot ensure that it will always work on either your end or our end.  If the connection with a video visit is poor, the visit may have to be switched to a telephone visit.  With either a video or telephone visit, we are not always able to ensure that we have a secure connection.     I need to obtain your verbal consent now.   Are you willing to proceed with your visit today? Yes   Ernest BudLevi G Fritzler has provided verbal consent on 03/02/2021 for a virtual visit (video or telephone).   Rennis HardingBrittany Rogen Porte, [place] JerseyPA-C   Date: 03/02/2021 3:07 PM   Virtual Visit via Video Note   I, Rennis HardingBrittany Aster Eckrich, connected with  Ernest BudLevi G Lascala  ([id number], 1995-07-23) on 03/02/21 at  3:00 PM EST by a video-enabled telemedicine application and verified that I am speaking with the correct person using two identifiers.  Location: Patient: Virtual Visit Location Patient: Home Provider: Virtual Visit Location Provider: Home Office   I discussed the limitations of evaluation and  management by telemedicine and the availability of in person appointments. The patient expressed understanding and agreed to proceed.    History of Present Illness: Ernest Haynes is a 26 y.o. who identifies as a transgender male who was assigned adult at birth, and is being seen today for sinus pain and pressure x 1 week, worse over the past 24 hours.  Admits to sick exposure.  Has had negative covid test at home.  Has tried OTC medication without relief.  Worse at night.  Reports previous symptoms in the past with sinus infection.  Denies fever, chills, sore throat, cough, vomiting, or diarrhea.    HPI: HPI  Problems:  Patient Active Problem List   Diagnosis Date Noted   Transgender 07/22/2017   Gender dysphoria in adult 07/22/2017    Allergies:  Allergies  Allergen Reactions   Meloxicam    Omnicef [Cefdinir]    Medications:  Current Outpatient Medications:    amoxicillin-clavulanate (AUGMENTIN) 875-125 MG tablet, Take 1 tablet by mouth 2 (two) times daily., Disp: 20 tablet, Rfl: 0   escitalopram (LEXAPRO) 10 MG tablet, Take 1 tablet by mouth once daily, Disp: 90 tablet, Rfl: 0   escitalopram (LEXAPRO) 5 MG tablet, Take 1 tablet by mouth once daily, Disp: 30 tablet, Rfl: 1   HYDROcodone-acetaminophen (NORCO/VICODIN) 5-325 MG tablet, Take 1 tablet by mouth every 4 (four) hours as needed., Disp:  10 tablet, Rfl: 0   hyoscyamine (LEVSIN) 0.125 MG tablet, TAKE 1 TO 2 TABLETS BY MOUTH 3 TIMES DAILY AS NEEDED FOR ABDOMINAL CRAMPING, Disp: 90 tablet, Rfl: 2   hyoscyamine (LEVSIN) 0.125 MG tablet, TAKE 1 TO 2 TABLETS BY MOUTH THREE TIMES DAILY AS NEEDED FOR ABDOMINAL CRAMPING, Disp: 90 tablet, Rfl: 2   ibuprofen (ADVIL) 400 MG tablet, Take 400 mg by mouth every 6 (six) hours as needed., Disp: , Rfl:    Lactobacillus (PROBIOTIC ACIDOPHILUS PO), Take by mouth every evening., Disp: , Rfl:    melatonin 1 MG TABS tablet, Take by mouth every evening. 1.5 mg, Disp: , Rfl:    naproxen (NAPROSYN)  500 MG tablet, Take 500 mg by mouth as needed. , Disp: , Rfl:    NEEDLE, DISP, 18 G (B-D BLUNT FILL NEEDLE) 18G X 1-1/2" MISC, Use as directed for testosterone injectioins, Disp: 50 each, Rfl: 3   NEEDLE, DISP, 25 G (B-D HYPODERMIC NEEDLE 25GX1") 25G X 1" MISC, Use for testosterone injection as directed, Disp: 50 each, Rfl: 3   SYRINGE-NEEDLE, DISP, 3 ML (B-D 3CC LUER-LOK SYR 25GX1") 25G X 1" 3 ML MISC, Use as directed to inject intramuscularly once a week, Disp: 12 each, Rfl: 0   norethindrone (MICRONOR) 0.35 MG tablet, Take 1 tablet by mouth daily, Disp: 28 tablet, Rfl: 3   Syringe, Disposable, (B-D SYRINGE LUER-LOK 1CC) 1 ML MISC, Use as directed for testosterone injection, Disp: 50 each, Rfl: 3   SYRINGE-NEEDLE, DISP, 3 ML (B-D 3CC LUER-LOK SYR 18GX1-1/2) 18G X 1-1/2" 3 ML MISC, Use as directed into the muscle once a week, Disp: 12 each, Rfl: 3   tamsulosin (FLOMAX) 0.4 MG CAPS capsule, Take 1 capsule (0.4 mg total) by mouth every evening. Take until the stent is removed., Disp: 30 capsule, Rfl:    tamsulosin (FLOMAX) 0.4 MG CAPS capsule, Take 1 capsule by mouth daily, Disp: 30 capsule, Rfl: 2   testosterone cypionate (DEPOTESTOSTERONE CYPIONATE) 200 MG/ML injection, INJECT 0.4 MLS INTO THE MUSCLE ONCE A WEEK, Disp: 1 mL, Rfl: 3   testosterone cypionate (DEPOTESTOSTERONE CYPIONATE) 200 MG/ML injection, Inject 0.4 ml into the muscle once a week., Disp: 2 mL, Rfl: 3   testosterone cypionate (DEPOTESTOSTERONE CYPIONATE) 200 MG/ML injection, Inject 0.22ml into the muscle once a week, Disp: 6 mL, Rfl: 3  Observations/Objective: Patient is well-developed, well-nourished in no acute distress.  Resting comfortably at home. Appears mildly fatigued, but nontoxic Head is normocephalic, atraumatic.  No labored breathing. Speaking in full sentences and tolerating own secretions Speech is clear and coherent with logical content.  Patient is alert and oriented at baseline.    Assessment and Plan: 1. Acute  non-recurrent sinusitis, unspecified location  Rest and push fluids Augmentin prescribed.  Take as directed and to completion.  Patient reports rash as child with cefdinir, otherwise denies allergy to penicillin class Continue with OTC ibuprofen/tylenol as needed for pain Follow up with PCP as needed Follow up in person with urgent care or go to the ED if you have any new or worsening symptoms such as fever, chills, worsening sinus pain/pressure, cough, sore throat, chest pain, shortness of breath, abdominal pain, changes in bowel or bladder habits, etc...   Follow Up Instructions: I discussed the assessment and treatment plan with the patient. The patient was provided an opportunity to ask questions and all were answered. The patient agreed with the plan and demonstrated an understanding of the instructions.  A copy of instructions were sent  to the patient via MyChart unless otherwise noted below.   The patient was advised to call back or seek an in-person evaluation if the symptoms worsen or if the condition fails to improve as anticipated.  Time:  I spent 5-10 minutes with the patient via telehealth technology discussing the above problems/concerns.    Lestine Box, PA-C

## 2021-03-14 ENCOUNTER — Other Ambulatory Visit (HOSPITAL_COMMUNITY): Payer: Self-pay

## 2021-03-24 ENCOUNTER — Other Ambulatory Visit (HOSPITAL_COMMUNITY): Payer: Self-pay

## 2021-03-24 MED ORDER — METRONIDAZOLE 500 MG PO TABS
ORAL_TABLET | ORAL | 0 refills | Status: DC
  Filled 2021-03-24: qty 14, 7d supply, fill #0

## 2021-04-08 ENCOUNTER — Other Ambulatory Visit (HOSPITAL_COMMUNITY): Payer: Self-pay

## 2021-04-08 MED ORDER — ESCITALOPRAM OXALATE 10 MG PO TABS
10.0000 mg | ORAL_TABLET | Freq: Every day | ORAL | 1 refills | Status: DC
  Filled 2021-04-08: qty 90, 90d supply, fill #0
  Filled 2021-07-11: qty 90, 90d supply, fill #1

## 2021-04-10 ENCOUNTER — Other Ambulatory Visit (HOSPITAL_COMMUNITY): Payer: Self-pay

## 2021-05-07 ENCOUNTER — Other Ambulatory Visit (HOSPITAL_COMMUNITY): Payer: Self-pay

## 2021-05-08 ENCOUNTER — Other Ambulatory Visit (HOSPITAL_COMMUNITY): Payer: Self-pay

## 2021-05-10 ENCOUNTER — Other Ambulatory Visit (HOSPITAL_COMMUNITY): Payer: Self-pay

## 2021-05-12 ENCOUNTER — Other Ambulatory Visit (HOSPITAL_COMMUNITY): Payer: Self-pay

## 2021-05-13 ENCOUNTER — Other Ambulatory Visit (HOSPITAL_COMMUNITY): Payer: Self-pay

## 2021-05-27 ENCOUNTER — Other Ambulatory Visit (HOSPITAL_COMMUNITY): Payer: Self-pay

## 2021-05-27 MED ORDER — "BD LUER-LOK SYRINGE 25G X 1"" 3 ML MISC": 0 refills | Status: AC

## 2021-06-06 ENCOUNTER — Other Ambulatory Visit (HOSPITAL_COMMUNITY): Payer: Self-pay

## 2021-06-09 ENCOUNTER — Other Ambulatory Visit (HOSPITAL_COMMUNITY): Payer: Self-pay

## 2021-06-09 MED ORDER — NORETHINDRONE 0.35 MG PO TABS
1.0000 | ORAL_TABLET | Freq: Every day | ORAL | 3 refills | Status: DC
  Filled 2021-06-09: qty 28, 28d supply, fill #0

## 2021-07-11 ENCOUNTER — Other Ambulatory Visit (HOSPITAL_COMMUNITY): Payer: Self-pay

## 2021-09-20 ENCOUNTER — Other Ambulatory Visit (HOSPITAL_COMMUNITY): Payer: Self-pay

## 2021-09-21 ENCOUNTER — Telehealth: Payer: No Typology Code available for payment source | Admitting: Nurse Practitioner

## 2021-09-21 DIAGNOSIS — R399 Unspecified symptoms and signs involving the genitourinary system: Secondary | ICD-10-CM | POA: Diagnosis not present

## 2021-09-21 MED ORDER — SULFAMETHOXAZOLE-TRIMETHOPRIM 800-160 MG PO TABS: 1.0000 | ORAL_TABLET | Freq: Two times a day (BID) | ORAL | 0 refills | Status: DC

## 2021-09-21 NOTE — Progress Notes (Signed)
E-Visit for Urinary Problems ° °We are sorry that you are not feeling well.  Here is how we plan to help! ° °Based on what you shared with me it looks like you most likely have a simple urinary tract infection. ° °A UTI (Urinary Tract Infection) is a bacterial infection of the bladder. ° °Most cases of urinary tract infections are simple to treat but a key part of your care is to encourage you to drink plenty of fluids and watch your symptoms carefully. ° °I have prescribed Bactrim DS One tablet twice a day for 5 days.  Your symptoms should gradually improve. Call us if the burning in your urine worsens, you develop worsening fever, back pain or pelvic pain or if your symptoms do not resolve after completing the antibiotic. ° °Urinary tract infections can be prevented by drinking plenty of water to keep your body hydrated.  Also be sure when you wipe, wipe from front to back and don't hold it in!  If possible, empty your bladder every 4 hours. ° °HOME CARE °Drink plenty of fluids °Compete the full course of the antibiotics even if the symptoms resolve °Remember, when you need to go…go. Holding in your urine can increase the likelihood of getting a UTI! °GET HELP RIGHT AWAY IF: °You cannot urinate °You get a high fever °Worsening back pain occurs °You see blood in your urine °You feel sick to your stomach or throw up °You feel like you are going to pass out ° °MAKE SURE YOU  °Understand these instructions. °Will watch your condition. °Will get help right away if you are not doing well or get worse. ° ° °Thank you for choosing an e-visit. ° °Your e-visit answers were reviewed by a board certified advanced clinical practitioner to complete your personal care plan. Depending upon the condition, your plan could have included both over the counter or prescription medications. ° °Please review your pharmacy choice. Make sure the pharmacy is open so you can pick up prescription now. If there is a problem, you may contact  your provider through MyChart messaging and have the prescription routed to another pharmacy.  Your safety is important to us. If you have drug allergies check your prescription carefully.  ° °For the next 24 hours you can use MyChart to ask questions about today's visit, request a non-urgent call back, or ask for a work or school excuse. °You will get an email in the next two days asking about your experience. I hope that your e-visit has been valuable and will speed your recovery. ° °5-10 minutes spent reviewing and documenting in chart. ° °

## 2021-09-22 ENCOUNTER — Other Ambulatory Visit (HOSPITAL_COMMUNITY): Payer: Self-pay

## 2021-09-22 MED ORDER — TESTOSTERONE CYPIONATE 200 MG/ML IM SOLN
INTRAMUSCULAR | 3 refills | Status: DC
  Filled 2021-09-22: qty 6, 42d supply, fill #0
  Filled 2021-12-11: qty 6, 42d supply, fill #1

## 2021-09-26 ENCOUNTER — Other Ambulatory Visit (HOSPITAL_COMMUNITY): Payer: Self-pay

## 2021-09-26 MED ORDER — NITROFURANTOIN MONOHYD MACRO 100 MG PO CAPS
ORAL_CAPSULE | ORAL | 0 refills | Status: DC
Start: 2021-09-26 — End: 2022-02-27
  Filled 2021-09-26: qty 10, 5d supply, fill #0

## 2021-09-29 ENCOUNTER — Other Ambulatory Visit (HOSPITAL_COMMUNITY): Payer: Self-pay

## 2021-09-29 MED ORDER — ONDANSETRON HCL 8 MG PO TABS
ORAL_TABLET | ORAL | 0 refills | Status: AC
  Filled 2021-09-29: qty 20, 10d supply, fill #0

## 2021-10-08 IMAGING — NM NM HEPATO W/GB/PHARM/[PERSON_NAME]
2 series · 12 of 12 positions shown · non-contrast
Comparison: None

CLINICAL DATA: Upper to mid abdominal pain for 5 months worse after
meals, constipation and occasional diarrhea, lactose intolerance

EXAM:
NUCLEAR MEDICINE HEPATOBILIARY IMAGING WITH GALLBLADDER EF
TECHNIQUE: Sequential images of the abdomen were obtained [DATE] minutes
following intravenous administration of radiopharmaceutical. After
oral ingestion of Ensure, gallbladder ejection fraction was
determined. At 60 min, normal ejection fraction is greater than 33%.
RADIOPHARMACEUTICALS:  5.4 mCi Cc-44m  Choletec IV

[he hepatobiliary · 4.52mm/px · 6 of 60 frames shown (1 of 2)]
[frame 6/60]
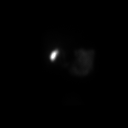
[frame 16/60]
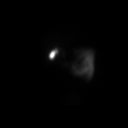
[frame 26/60]
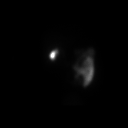
[frame 36/60]
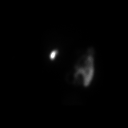
[frame 46/60]
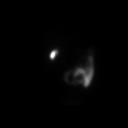
[frame 56/60]
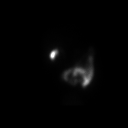

[he hepatobiliary · 4.52mm/px · 6 of 60 frames shown (2 of 2)]
[frame 6/60]
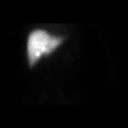
[frame 16/60]
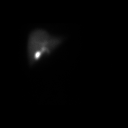
[frame 26/60]
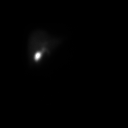
[frame 36/60]
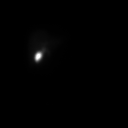
[frame 46/60]
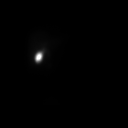
[frame 56/60]
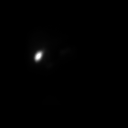

[12 of 12 positions shown; findings below may reference images not displayed]

FINDINGS: Normal tracer extraction from bloodstream indicating normal
hepatocellular function.

Normal excretion of tracer into biliary tree.

Gallbladder visualized at 9 min.

Small bowel visualized at 48 min.

No hepatic retention of tracer.

Subjectively normal emptying of tracer from gallbladder following
fatty meal stimulation.

Calculated gallbladder ejection fraction is 62%, normal.

Patient reported no symptoms following Ensure ingestion.

Normal gallbladder ejection fraction following Ensure ingestion is
greater than 33% at 1 hour.
IMPRESSION: Normal exam.

## 2021-10-13 ENCOUNTER — Other Ambulatory Visit (HOSPITAL_COMMUNITY): Payer: Self-pay

## 2021-10-16 ENCOUNTER — Other Ambulatory Visit (HOSPITAL_COMMUNITY): Payer: Self-pay

## 2021-10-17 ENCOUNTER — Other Ambulatory Visit (HOSPITAL_COMMUNITY): Payer: Self-pay

## 2021-10-20 ENCOUNTER — Other Ambulatory Visit (HOSPITAL_COMMUNITY): Payer: Self-pay

## 2021-10-20 MED ORDER — ESCITALOPRAM OXALATE 10 MG PO TABS
10.0000 mg | ORAL_TABLET | Freq: Every day | ORAL | 0 refills | Status: DC
  Filled 2021-10-20: qty 90, 90d supply, fill #0

## 2021-11-12 ENCOUNTER — Other Ambulatory Visit (HOSPITAL_COMMUNITY): Payer: Self-pay

## 2021-11-12 MED ORDER — ESCITALOPRAM OXALATE 10 MG PO TABS
10.0000 mg | ORAL_TABLET | Freq: Every day | ORAL | 0 refills | Status: DC
  Filled 2021-11-12 – 2022-01-14 (×2): qty 90, 90d supply, fill #0

## 2021-11-22 ENCOUNTER — Other Ambulatory Visit (HOSPITAL_COMMUNITY): Payer: Self-pay

## 2021-12-12 ENCOUNTER — Other Ambulatory Visit (HOSPITAL_COMMUNITY): Payer: Self-pay

## 2022-01-14 ENCOUNTER — Other Ambulatory Visit (HOSPITAL_COMMUNITY): Payer: Self-pay

## 2022-02-27 ENCOUNTER — Other Ambulatory Visit (HOSPITAL_COMMUNITY): Payer: Self-pay

## 2022-02-27 ENCOUNTER — Telehealth: Payer: No Typology Code available for payment source | Admitting: Physician Assistant

## 2022-02-27 DIAGNOSIS — J019 Acute sinusitis, unspecified: Secondary | ICD-10-CM

## 2022-02-27 DIAGNOSIS — B9689 Other specified bacterial agents as the cause of diseases classified elsewhere: Secondary | ICD-10-CM | POA: Diagnosis not present

## 2022-02-27 MED ORDER — AMOXICILLIN-POT CLAVULANATE 875-125 MG PO TABS
1.0000 | ORAL_TABLET | Freq: Two times a day (BID) | ORAL | 0 refills | Status: DC
  Filled 2022-02-27: qty 14, 7d supply, fill #0

## 2022-02-27 NOTE — Progress Notes (Signed)

## 2022-02-28 ENCOUNTER — Other Ambulatory Visit (HOSPITAL_COMMUNITY): Payer: Self-pay

## 2022-03-10 DIAGNOSIS — F330 Major depressive disorder, recurrent, mild: Secondary | ICD-10-CM | POA: Diagnosis not present

## 2022-03-11 DIAGNOSIS — Z0000 Encounter for general adult medical examination without abnormal findings: Secondary | ICD-10-CM | POA: Diagnosis not present

## 2022-03-11 DIAGNOSIS — Z79890 Hormone replacement therapy (postmenopausal): Secondary | ICD-10-CM | POA: Diagnosis not present

## 2022-03-11 DIAGNOSIS — Z13220 Encounter for screening for lipoid disorders: Secondary | ICD-10-CM | POA: Diagnosis not present

## 2022-03-11 DIAGNOSIS — J4520 Mild intermittent asthma, uncomplicated: Secondary | ICD-10-CM | POA: Diagnosis not present

## 2022-03-25 DIAGNOSIS — F330 Major depressive disorder, recurrent, mild: Secondary | ICD-10-CM | POA: Diagnosis not present

## 2022-04-07 DIAGNOSIS — F330 Major depressive disorder, recurrent, mild: Secondary | ICD-10-CM | POA: Diagnosis not present

## 2022-04-08 ENCOUNTER — Other Ambulatory Visit (HOSPITAL_COMMUNITY): Payer: Self-pay

## 2022-04-09 ENCOUNTER — Other Ambulatory Visit (HOSPITAL_COMMUNITY): Payer: Self-pay

## 2022-04-09 MED ORDER — ESCITALOPRAM OXALATE 10 MG PO TABS
10.0000 mg | ORAL_TABLET | Freq: Every day | ORAL | 0 refills | Status: DC
  Filled 2022-04-09: qty 90, 90d supply, fill #0

## 2022-04-09 MED ORDER — TESTOSTERONE CYPIONATE 200 MG/ML IM SOLN
INTRAMUSCULAR | 3 refills | Status: DC
  Filled 2022-04-09: qty 4, 28d supply, fill #0
  Filled 2022-06-19: qty 4, 28d supply, fill #1
  Filled 2022-09-08: qty 1, 7d supply, fill #2

## 2022-04-14 ENCOUNTER — Other Ambulatory Visit (HOSPITAL_COMMUNITY): Payer: Self-pay

## 2022-04-22 ENCOUNTER — Other Ambulatory Visit (HOSPITAL_COMMUNITY): Payer: Self-pay

## 2022-04-28 DIAGNOSIS — F330 Major depressive disorder, recurrent, mild: Secondary | ICD-10-CM | POA: Diagnosis not present

## 2022-04-29 ENCOUNTER — Other Ambulatory Visit (HOSPITAL_COMMUNITY): Payer: Self-pay

## 2022-04-29 DIAGNOSIS — L02214 Cutaneous abscess of groin: Secondary | ICD-10-CM | POA: Diagnosis not present

## 2022-04-29 MED ORDER — AMOXICILLIN-POT CLAVULANATE 500-125 MG PO TABS
1.0000 | ORAL_TABLET | Freq: Two times a day (BID) | ORAL | 0 refills | Status: DC
  Filled 2022-04-29: qty 10, 5d supply, fill #0

## 2022-05-12 ENCOUNTER — Other Ambulatory Visit (HOSPITAL_COMMUNITY): Payer: Self-pay

## 2022-05-12 MED ORDER — ESCITALOPRAM OXALATE 10 MG PO TABS
10.0000 mg | ORAL_TABLET | Freq: Every day | ORAL | 0 refills | Status: DC
  Filled 2022-07-15: qty 90, 90d supply, fill #0

## 2022-05-20 DIAGNOSIS — F330 Major depressive disorder, recurrent, mild: Secondary | ICD-10-CM | POA: Diagnosis not present

## 2022-06-03 DIAGNOSIS — F330 Major depressive disorder, recurrent, mild: Secondary | ICD-10-CM | POA: Diagnosis not present

## 2022-06-24 DIAGNOSIS — F330 Major depressive disorder, recurrent, mild: Secondary | ICD-10-CM | POA: Diagnosis not present

## 2022-07-07 DIAGNOSIS — F330 Major depressive disorder, recurrent, mild: Secondary | ICD-10-CM | POA: Diagnosis not present

## 2022-07-16 ENCOUNTER — Other Ambulatory Visit: Payer: Self-pay

## 2022-07-16 ENCOUNTER — Other Ambulatory Visit (HOSPITAL_COMMUNITY): Payer: Self-pay

## 2022-07-22 DIAGNOSIS — F330 Major depressive disorder, recurrent, mild: Secondary | ICD-10-CM | POA: Diagnosis not present

## 2022-07-28 DIAGNOSIS — Z113 Encounter for screening for infections with a predominantly sexual mode of transmission: Secondary | ICD-10-CM | POA: Diagnosis not present

## 2022-08-04 DIAGNOSIS — F330 Major depressive disorder, recurrent, mild: Secondary | ICD-10-CM | POA: Diagnosis not present

## 2022-08-11 DIAGNOSIS — F330 Major depressive disorder, recurrent, mild: Secondary | ICD-10-CM | POA: Diagnosis not present

## 2022-08-18 DIAGNOSIS — F330 Major depressive disorder, recurrent, mild: Secondary | ICD-10-CM | POA: Diagnosis not present

## 2022-08-25 DIAGNOSIS — F330 Major depressive disorder, recurrent, mild: Secondary | ICD-10-CM | POA: Diagnosis not present

## 2022-09-11 ENCOUNTER — Other Ambulatory Visit (HOSPITAL_COMMUNITY): Payer: Self-pay

## 2022-09-11 ENCOUNTER — Other Ambulatory Visit: Payer: Self-pay

## 2022-10-09 ENCOUNTER — Other Ambulatory Visit (HOSPITAL_COMMUNITY): Payer: Self-pay

## 2022-10-09 MED ORDER — TESTOSTERONE CYPIONATE 200 MG/ML IM SOLN
INTRAMUSCULAR | 3 refills | Status: AC
  Filled 2022-10-09: qty 6, 84d supply, fill #0
  Filled 2022-10-24: qty 6, 42d supply, fill #0

## 2022-10-10 ENCOUNTER — Other Ambulatory Visit (HOSPITAL_COMMUNITY): Payer: Self-pay

## 2022-10-16 ENCOUNTER — Other Ambulatory Visit (HOSPITAL_COMMUNITY): Payer: Self-pay

## 2022-10-23 ENCOUNTER — Other Ambulatory Visit (HOSPITAL_COMMUNITY): Payer: Self-pay

## 2022-10-24 ENCOUNTER — Other Ambulatory Visit (HOSPITAL_COMMUNITY): Payer: Self-pay

## 2022-10-27 ENCOUNTER — Other Ambulatory Visit (HOSPITAL_COMMUNITY): Payer: Self-pay

## 2022-11-05 DIAGNOSIS — F330 Major depressive disorder, recurrent, mild: Secondary | ICD-10-CM | POA: Diagnosis not present

## 2022-11-12 DIAGNOSIS — F330 Major depressive disorder, recurrent, mild: Secondary | ICD-10-CM | POA: Diagnosis not present

## 2022-11-12 DIAGNOSIS — H01005 Unspecified blepharitis left lower eyelid: Secondary | ICD-10-CM | POA: Diagnosis not present

## 2022-11-19 DIAGNOSIS — F330 Major depressive disorder, recurrent, mild: Secondary | ICD-10-CM | POA: Diagnosis not present

## 2022-11-26 DIAGNOSIS — F330 Major depressive disorder, recurrent, mild: Secondary | ICD-10-CM | POA: Diagnosis not present

## 2022-12-03 ENCOUNTER — Other Ambulatory Visit (HOSPITAL_COMMUNITY): Payer: Self-pay

## 2022-12-03 MED ORDER — ESCITALOPRAM OXALATE 10 MG PO TABS
10.0000 mg | ORAL_TABLET | Freq: Every day | ORAL | 0 refills | Status: AC
  Filled 2022-12-03: qty 30, 30d supply, fill #0

## 2022-12-10 DIAGNOSIS — F330 Major depressive disorder, recurrent, mild: Secondary | ICD-10-CM | POA: Diagnosis not present

## 2022-12-17 DIAGNOSIS — F330 Major depressive disorder, recurrent, mild: Secondary | ICD-10-CM | POA: Diagnosis not present

## 2022-12-24 DIAGNOSIS — F330 Major depressive disorder, recurrent, mild: Secondary | ICD-10-CM | POA: Diagnosis not present

## 2023-01-07 DIAGNOSIS — F330 Major depressive disorder, recurrent, mild: Secondary | ICD-10-CM | POA: Diagnosis not present

## 2023-01-19 ENCOUNTER — Other Ambulatory Visit (HOSPITAL_COMMUNITY): Payer: Self-pay

## 2023-01-21 DIAGNOSIS — F330 Major depressive disorder, recurrent, mild: Secondary | ICD-10-CM | POA: Diagnosis not present

## 2023-02-04 DIAGNOSIS — F330 Major depressive disorder, recurrent, mild: Secondary | ICD-10-CM | POA: Diagnosis not present

## 2023-02-18 DIAGNOSIS — F330 Major depressive disorder, recurrent, mild: Secondary | ICD-10-CM | POA: Diagnosis not present

## 2023-03-04 DIAGNOSIS — F330 Major depressive disorder, recurrent, mild: Secondary | ICD-10-CM | POA: Diagnosis not present

## 2023-03-18 DIAGNOSIS — F330 Major depressive disorder, recurrent, mild: Secondary | ICD-10-CM | POA: Diagnosis not present

## 2023-04-01 DIAGNOSIS — F330 Major depressive disorder, recurrent, mild: Secondary | ICD-10-CM | POA: Diagnosis not present

## 2023-04-15 DIAGNOSIS — F330 Major depressive disorder, recurrent, mild: Secondary | ICD-10-CM | POA: Diagnosis not present

## 2023-04-29 DIAGNOSIS — F330 Major depressive disorder, recurrent, mild: Secondary | ICD-10-CM | POA: Diagnosis not present

## 2023-05-13 DIAGNOSIS — F330 Major depressive disorder, recurrent, mild: Secondary | ICD-10-CM | POA: Diagnosis not present

## 2023-05-27 DIAGNOSIS — F330 Major depressive disorder, recurrent, mild: Secondary | ICD-10-CM | POA: Diagnosis not present

## 2023-06-10 DIAGNOSIS — F330 Major depressive disorder, recurrent, mild: Secondary | ICD-10-CM | POA: Diagnosis not present

## 2023-06-24 DIAGNOSIS — F330 Major depressive disorder, recurrent, mild: Secondary | ICD-10-CM | POA: Diagnosis not present

## 2023-07-08 DIAGNOSIS — F330 Major depressive disorder, recurrent, mild: Secondary | ICD-10-CM | POA: Diagnosis not present

## 2023-07-22 DIAGNOSIS — F330 Major depressive disorder, recurrent, mild: Secondary | ICD-10-CM | POA: Diagnosis not present

## 2023-08-02 DIAGNOSIS — R109 Unspecified abdominal pain: Secondary | ICD-10-CM | POA: Diagnosis not present

## 2023-08-02 DIAGNOSIS — K429 Umbilical hernia without obstruction or gangrene: Secondary | ICD-10-CM | POA: Diagnosis not present

## 2023-08-04 DIAGNOSIS — H5213 Myopia, bilateral: Secondary | ICD-10-CM | POA: Diagnosis not present

## 2023-08-05 DIAGNOSIS — H5213 Myopia, bilateral: Secondary | ICD-10-CM | POA: Diagnosis not present

## 2023-08-05 DIAGNOSIS — F330 Major depressive disorder, recurrent, mild: Secondary | ICD-10-CM | POA: Diagnosis not present

## 2023-08-06 DIAGNOSIS — N760 Acute vaginitis: Secondary | ICD-10-CM | POA: Diagnosis not present

## 2023-08-06 DIAGNOSIS — R300 Dysuria: Secondary | ICD-10-CM | POA: Diagnosis not present

## 2023-08-19 DIAGNOSIS — Z0000 Encounter for general adult medical examination without abnormal findings: Secondary | ICD-10-CM | POA: Diagnosis not present

## 2023-08-19 DIAGNOSIS — J4520 Mild intermittent asthma, uncomplicated: Secondary | ICD-10-CM | POA: Diagnosis not present

## 2023-08-19 DIAGNOSIS — F330 Major depressive disorder, recurrent, mild: Secondary | ICD-10-CM | POA: Diagnosis not present

## 2023-08-19 DIAGNOSIS — Z79890 Hormone replacement therapy (postmenopausal): Secondary | ICD-10-CM | POA: Diagnosis not present

## 2023-08-19 DIAGNOSIS — R739 Hyperglycemia, unspecified: Secondary | ICD-10-CM | POA: Diagnosis not present

## 2023-08-19 DIAGNOSIS — R1084 Generalized abdominal pain: Secondary | ICD-10-CM | POA: Diagnosis not present

## 2023-09-02 DIAGNOSIS — F330 Major depressive disorder, recurrent, mild: Secondary | ICD-10-CM | POA: Diagnosis not present

## 2023-09-16 DIAGNOSIS — F330 Major depressive disorder, recurrent, mild: Secondary | ICD-10-CM | POA: Diagnosis not present

## 2023-09-25 NOTE — Progress Notes (Unsigned)
 [facility] CONSULT NOTE  Patient Care Team: Sun, Vyvyan, MD as PCP - General (Family Medicine)  ASSESSMENT & PLAN 28 y.o.adult with history of anxiety, depression, transgender on testosterone  referred to [facility] for polycythemia.  Patient is a transgender male on testosterone .  Report of chronic use of it. Not sure if may have sleep apnea.  Labs showed long term polycythemia.  Discussed differential diagnosis of polycythemia.  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  Patient has a risk factor from testosterone  use, which is a risk factor for polycythemia.  Recent CT was negative for liver and kidney tumor.  Discussed other testing such as sleep apnea, JAK2 mutation for primary polycythemia can be considered.  JAK2 is not common in this age group.  After discussion, patient is comfortable not obtaining at this time but if continued rising hemoglobin level.  Discussed risk of thrombosis, like CVA, MI and blood clots with testosterone  use which the patient understands.  Discussed increased fluid intake to prevent dehydration. Assessment & Plan Polycythemia, secondary In the setting of testosterone  use If continued rise in the future, may test for primary polycythemia Consider testing for sleep apnea   Follow-up as needed  09/27/2023 2:54 PM  Thank you for the consult.  CHIEF COMPLAINTS/PURPOSE OF CONSULTATION:  Erythrocytosis  HISTORY OF PRESENTING ILLNESS:  Ernest Haynes 28 y.o. adult is here because of elevated hemoglobin.  He was found to have abnormal CBC from about the past few years. He quitted cigarettes in 2022. He never suffer from diagnosis of blood clot.   No history Chronic pulmonary disease No: Sleep apnea No: Massive obesity No: Congenital or chronic heart condition No: Heavy smoking/chronic carbon monoxide poisoning Yes: Use of an anabolic steroids. Has been using testosterone  for 9-10 years. Through  endocrinology. Weekly self injection. No: Use of diuretics lead to reduced plasma volume. No: Hematuria No: History of heavy alcohol use or cirrhosis No: Pruritus after bathing, erythromelalgia, gout, thrombosis, early satiety No: Living in high [place]  No night sweats, unexpected weight loss, early satiety.  MEDICAL HISTORY:  Past Medical History:  Diagnosis Date   Asthma    Childhood   History of kidney stones    Wears glasses     SURGICAL HISTORY: Past Surgical History:  Procedure Laterality Date   CYSTOSCOPY/URETEROSCOPY/HOLMIUM LASER/STENT PLACEMENT Right 07/28/2019   Procedure: RIGHT URETEROSCOPY HOLMIUM LASER STONE EXTRACTION STENT PLACEMENT;  Surgeon: Cam Morene ORN, MD;  Location: [facility];  Service: Urology;  Laterality: Right;   MASTECTOMY     WISDOM TOOTH EXTRACTION      SOCIAL HISTORY: Social History   Socioeconomic History   Marital status: Single    Spouse name: Not on file   Number of children: Not on file   Years of education: Not on file   Highest education level: Not on file  Occupational History   Not on file  Tobacco Use   Smoking status: Every Day    Types: E-cigarettes   Smokeless tobacco: Never  Vaping Use   Vaping status: Every Day  Substance and Sexual Activity   Alcohol use: Yes    Comment: once a week   Drug use: No   Sexual activity: Not on file  Other Topics Concern   Not on file  Social History Narrative   Not on file   Social Drivers of Health   Financial Resource Strain: Not on file  Food Insecurity: Not on file  Transportation Needs:  Not on file  Physical Activity: Not on file  Stress: Not on file  Social Connections: Not on file  Intimate Partner Violence: Not on file    FAMILY HISTORY: No family history on file.  ALLERGIES:  is allergic to meloxicam and omnicef [cefdinir].  MEDICATIONS:  Current Outpatient Medications  Medication Sig Dispense Refill   escitalopram  (LEXAPRO ) 10 MG  tablet Take 1 tablet (10 mg total) by mouth daily. 30 tablet 0   ibuprofen (ADVIL) 400 MG tablet Take 400 mg by mouth every 6 (six) hours as needed.     Lactobacillus (PROBIOTIC ACIDOPHILUS PO) Take by mouth every evening.     melatonin 1 MG TABS tablet Take by mouth every evening. 1.5 mg     naproxen (NAPROSYN) 500 MG tablet Take 500 mg by mouth as needed.      NEEDLE, DISP, 18 G (B-D BLUNT FILL NEEDLE) 18G X 1-1/2 MISC Use as directed for testosterone  injectioins 50 each 3   NEEDLE, DISP, 25 G (B-D HYPODERMIC NEEDLE 25GX1) 25G X 1 MISC Use for testosterone  injection as directed 50 each 3   Syringe, Disposable, (B-D SYRINGE LUER-LOK 1CC) 1 ML MISC Use as directed for testosterone  injection 50 each 3   SYRINGE-NEEDLE, DISP, 3 ML (B-D 3CC LUER-LOK SYR 18GX1-1/2) 18G X 1-1/2 3 ML MISC Use as directed into the muscle once a week 12 each 3   SYRINGE-NEEDLE, DISP, 3 ML (B-D 3CC LUER-LOK SYR 25GX1) 25G X 1 3 ML MISC Use as directed to inject intramuscularly once a week 12 each 0   SYRINGE-NEEDLE, DISP, 3 ML (B-D 3CC LUER-LOK SYR 25GX1) 25G X 1 3 ML MISC Use as directed on inject IM once a week 12 each 0   testosterone  cypionate (DEPOTESTOSTERONE CYPIONATE) 200 MG/ML injection Inject 0.4ml into the muscle once a week (single use vial) 6 mL 3   HYDROcodone -acetaminophen  (NORCO/VICODIN) 5-325 MG tablet Take 1 tablet by mouth every 4 (four) hours as needed. 10 tablet 0   ondansetron  (ZOFRAN ) 8 MG tablet Take 1 tablet by mouth twice a day as needed for 10 days (Patient not taking: Reported on 09/27/2023) 20 tablet 0   testosterone  cypionate (DEPOTESTOSTERONE CYPIONATE) 200 MG/ML injection INJECT 0.4 MLS INTO THE MUSCLE ONCE A WEEK 1 mL 3   testosterone  cypionate (DEPOTESTOSTERONE CYPIONATE) 200 MG/ML injection Inject 0.4 ml into the muscle once a week. 2 mL 3   No current facility-administered medications for this visit.    REVIEW OF SYSTEMS:   All relevant systems were reviewed with the patient and  are negative.  PHYSICAL EXAMINATION: ECOG PERFORMANCE STATUS: 0 - Asymptomatic  Vitals:   09/27/23 1252  BP: 127/80  Pulse: 100  Resp: 20  Temp: 98.6 F (37 C)  SpO2: 93%   Filed Weights   09/27/23 1252  Weight: 225 lb 11.2 oz (102.4 kg)    GENERAL: alert, no distress and comfortable SKIN: skin color normal and no bruising or petechiae on exposed skin EYES: normal, sclera clear OROPHARYNX: no exudate  NECK: No palpable mass LYMPH:  no palpable cervical, axillary or inguinal lymphadenopathy  LUNGS: clear to auscultation and percussion with normal breathing effort HEART: regular rate & rhythm and no murmurs  ABDOMEN: abdomen soft, non-tender and nondistended. Musculoskeletal: no edema NEURO: no focal motor/sensory deficits  LABORATORY DATA:  I have reviewed the data available from outside records  RADIOGRAPHIC STUDIES: I reviewed the CT report from outside records.   Ernest JAYSON Chihuahua, MD 7/28/20252:54 PM

## 2023-09-27 ENCOUNTER — Inpatient Hospital Stay: Payer: Self-pay

## 2023-09-27 VITALS — BP 127/80 | HR 100 | Temp 98.6°F | Resp 20 | Ht 70.0 in | Wt 225.7 lb

## 2023-09-27 DIAGNOSIS — D751 Secondary polycythemia: Secondary | ICD-10-CM | POA: Insufficient documentation

## 2023-09-27 DIAGNOSIS — F17290 Nicotine dependence, other tobacco product, uncomplicated: Secondary | ICD-10-CM | POA: Insufficient documentation

## 2023-09-27 DIAGNOSIS — F640 Transsexualism: Secondary | ICD-10-CM | POA: Insufficient documentation

## 2023-09-27 NOTE — Assessment & Plan Note (Addendum)
 In the setting of testosterone  use If continued rise in the future, may test for primary polycythemia Consider testing for sleep apnea

## 2023-09-30 DIAGNOSIS — F330 Major depressive disorder, recurrent, mild: Secondary | ICD-10-CM | POA: Diagnosis not present

## 2023-10-14 DIAGNOSIS — F330 Major depressive disorder, recurrent, mild: Secondary | ICD-10-CM | POA: Diagnosis not present

## 2023-10-14 DIAGNOSIS — Z87442 Personal history of urinary calculi: Secondary | ICD-10-CM | POA: Diagnosis not present

## 2023-10-14 DIAGNOSIS — N200 Calculus of kidney: Secondary | ICD-10-CM | POA: Diagnosis not present

## 2023-10-25 DIAGNOSIS — F411 Generalized anxiety disorder: Secondary | ICD-10-CM | POA: Diagnosis not present

## 2023-10-25 DIAGNOSIS — F330 Major depressive disorder, recurrent, mild: Secondary | ICD-10-CM | POA: Diagnosis not present

## 2023-11-29 DIAGNOSIS — F330 Major depressive disorder, recurrent, mild: Secondary | ICD-10-CM | POA: Diagnosis not present

## 2023-12-13 DIAGNOSIS — F330 Major depressive disorder, recurrent, mild: Secondary | ICD-10-CM | POA: Diagnosis not present

## 2023-12-27 DIAGNOSIS — F330 Major depressive disorder, recurrent, mild: Secondary | ICD-10-CM | POA: Diagnosis not present

## 2024-01-10 DIAGNOSIS — F330 Major depressive disorder, recurrent, mild: Secondary | ICD-10-CM | POA: Diagnosis not present

## 2024-01-17 DIAGNOSIS — F411 Generalized anxiety disorder: Secondary | ICD-10-CM | POA: Diagnosis not present

## 2024-01-24 DIAGNOSIS — F330 Major depressive disorder, recurrent, mild: Secondary | ICD-10-CM | POA: Diagnosis not present

## 2024-01-24 DIAGNOSIS — J0110 Acute frontal sinusitis, unspecified: Secondary | ICD-10-CM | POA: Diagnosis not present

## 2024-02-07 DIAGNOSIS — F330 Major depressive disorder, recurrent, mild: Secondary | ICD-10-CM | POA: Diagnosis not present

## 2024-02-07 DIAGNOSIS — H01139 Eczematous dermatitis of unspecified eye, unspecified eyelid: Secondary | ICD-10-CM | POA: Diagnosis not present

## 2024-02-18 DIAGNOSIS — D751 Secondary polycythemia: Secondary | ICD-10-CM | POA: Diagnosis not present

## 2024-02-18 DIAGNOSIS — F649 Gender identity disorder, unspecified: Secondary | ICD-10-CM | POA: Diagnosis not present

## 2024-02-18 DIAGNOSIS — N750 Cyst of Bartholin's gland: Secondary | ICD-10-CM | POA: Diagnosis not present

## 2024-02-18 DIAGNOSIS — Z79890 Hormone replacement therapy (postmenopausal): Secondary | ICD-10-CM | POA: Diagnosis not present

## 2024-02-21 DIAGNOSIS — F330 Major depressive disorder, recurrent, mild: Secondary | ICD-10-CM | POA: Diagnosis not present
